# Patient Record
Sex: Male | Born: 1970 | Race: White | Hispanic: No | Marital: Married | Smoking: Never smoker
Health system: Southern US, Community
[De-identification: ages and names within clinical notes are randomized; demographics above are authoritative.]

## PROBLEM LIST (undated history)

## (undated) DIAGNOSIS — M545 Low back pain, unspecified: Secondary | ICD-10-CM

## (undated) DIAGNOSIS — Z87442 Personal history of urinary calculi: Secondary | ICD-10-CM

## (undated) DIAGNOSIS — E785 Hyperlipidemia, unspecified: Secondary | ICD-10-CM

## (undated) DIAGNOSIS — T300 Burn of unspecified body region, unspecified degree: Secondary | ICD-10-CM

## (undated) DIAGNOSIS — T7840XA Allergy, unspecified, initial encounter: Secondary | ICD-10-CM

## (undated) HISTORY — DX: Burn of unspecified body region, unspecified degree: T30.0

## (undated) HISTORY — DX: Allergy, unspecified, initial encounter: T78.40XA

## (undated) HISTORY — DX: Hyperlipidemia, unspecified: E78.5

## (undated) HISTORY — DX: Low back pain, unspecified: M54.50

## (undated) HISTORY — DX: Low back pain: M54.5

## (undated) HISTORY — PX: OTHER SURGICAL HISTORY: SHX169

## (undated) HISTORY — PX: WISDOM TOOTH EXTRACTION: SHX21

---

## 1992-09-22 HISTORY — PX: OTHER SURGICAL HISTORY: SHX169

## 2007-03-30 ENCOUNTER — Ambulatory Visit: Payer: Self-pay | Admitting: Family Medicine

## 2007-07-07 ENCOUNTER — Ambulatory Visit: Payer: Self-pay | Admitting: Family Medicine

## 2007-07-07 LAB — CONVERTED CEMR LAB
Bilirubin Urine: NEGATIVE
Blood in Urine, dipstick: NEGATIVE
Ketones, urine, test strip: NEGATIVE
pH: 6

## 2007-07-09 LAB — CONVERTED CEMR LAB
Alkaline Phosphatase: 41 units/L (ref 39–117)
BUN: 14 mg/dL (ref 6–23)
Basophils Absolute: 0 10*3/uL (ref 0.0–0.1)
CO2: 28 meq/L (ref 19–32)
Cholesterol: 191 mg/dL (ref 0–200)
Eosinophils Absolute: 0.1 10*3/uL (ref 0.0–0.6)
GFR calc Af Amer: 99 mL/min
GFR calc non Af Amer: 81 mL/min
HDL: 40.3 mg/dL (ref >39.0)
Hemoglobin: 15.6 g/dL (ref 13.0–17.0)
Lymphocytes Relative: 36.3 % (ref 12.0–46.0)
MCHC: 35.6 g/dL (ref 30.0–36.0)
MCV: 91.2 fL (ref 78.0–100.0)
Monocytes Absolute: 0.4 10*3/uL (ref 0.2–0.7)
Monocytes Relative: 11.2 % — ABNORMAL HIGH (ref 3.0–11.0)
Neutro Abs: 1.6 10*3/uL (ref 1.4–7.7)
Platelets: 158 10*3/uL (ref 150–400)
Potassium: 4.6 meq/L (ref 3.5–5.1)
Total Protein: 6.5 g/dL (ref 6.0–8.3)
Triglycerides: 144 mg/dL (ref 0–149)

## 2007-07-14 ENCOUNTER — Ambulatory Visit: Payer: Self-pay | Admitting: Family Medicine

## 2007-07-14 DIAGNOSIS — M545 Low back pain, unspecified: Secondary | ICD-10-CM | POA: Insufficient documentation

## 2007-07-14 DIAGNOSIS — E785 Hyperlipidemia, unspecified: Secondary | ICD-10-CM | POA: Insufficient documentation

## 2007-07-14 DIAGNOSIS — T24309A Burn of third degree of unspecified site of unspecified lower limb, except ankle and foot, initial encounter: Secondary | ICD-10-CM | POA: Insufficient documentation

## 2007-09-24 ENCOUNTER — Ambulatory Visit: Payer: Self-pay | Admitting: Family Medicine

## 2007-09-24 DIAGNOSIS — J018 Other acute sinusitis: Secondary | ICD-10-CM | POA: Insufficient documentation

## 2007-11-18 ENCOUNTER — Ambulatory Visit: Payer: Self-pay | Admitting: Family Medicine

## 2007-11-18 DIAGNOSIS — L259 Unspecified contact dermatitis, unspecified cause: Secondary | ICD-10-CM | POA: Insufficient documentation

## 2008-06-13 ENCOUNTER — Ambulatory Visit: Payer: Self-pay | Admitting: Family Medicine

## 2008-06-13 DIAGNOSIS — B356 Tinea cruris: Secondary | ICD-10-CM | POA: Insufficient documentation

## 2008-09-20 ENCOUNTER — Telehealth: Payer: Self-pay | Admitting: Family Medicine

## 2009-04-05 ENCOUNTER — Ambulatory Visit: Payer: Self-pay | Admitting: Family Medicine

## 2009-04-05 DIAGNOSIS — R002 Palpitations: Secondary | ICD-10-CM | POA: Insufficient documentation

## 2009-04-11 LAB — CONVERTED CEMR LAB
Alkaline Phosphatase: 41 units/L (ref 39–117)
Basophils Absolute: 0 10*3/uL (ref 0.0–0.1)
Bilirubin, Direct: 0.1 mg/dL (ref 0.0–0.3)
CO2: 29 meq/L (ref 19–32)
Calcium: 9.2 mg/dL (ref 8.4–10.5)
Cholesterol: 220 mg/dL — ABNORMAL HIGH (ref 0–200)
Creatinine, Ser: 1 mg/dL (ref 0.4–1.5)
Eosinophils Absolute: 0.1 10*3/uL (ref 0.0–0.7)
Glucose, Bld: 91 mg/dL (ref 70–99)
HDL: 48.3 mg/dL (ref >39.00)
Lymphocytes Relative: 33.4 % (ref 12.0–46.0)
MCHC: 35.2 g/dL (ref 30.0–36.0)
Neutrophils Relative %: 53 % (ref 43.0–77.0)
RBC: 5 M/uL (ref 4.22–5.81)
RDW: 11.7 % (ref 11.5–14.6)
Total CHOL/HDL Ratio: 5
Triglycerides: 205 mg/dL — ABNORMAL HIGH (ref 0.0–149.0)

## 2009-04-19 ENCOUNTER — Ambulatory Visit: Payer: Self-pay

## 2009-04-19 ENCOUNTER — Encounter: Payer: Self-pay | Admitting: Internal Medicine

## 2009-12-13 ENCOUNTER — Ambulatory Visit: Payer: Self-pay | Admitting: Family Medicine

## 2009-12-13 DIAGNOSIS — J309 Allergic rhinitis, unspecified: Secondary | ICD-10-CM | POA: Insufficient documentation
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
## 2011-01-09 ENCOUNTER — Other Ambulatory Visit (INDEPENDENT_AMBULATORY_CARE_PROVIDER_SITE_OTHER): Payer: 59 | Admitting: Family Medicine

## 2011-01-09 DIAGNOSIS — Z0000 Encounter for general adult medical examination without abnormal findings: Secondary | ICD-10-CM

## 2011-01-09 DIAGNOSIS — E785 Hyperlipidemia, unspecified: Secondary | ICD-10-CM

## 2011-01-09 LAB — BASIC METABOLIC PANEL
CO2: 29 mEq/L (ref 19–32)
Glucose, Bld: 82 mg/dL (ref 70–99)
Potassium: 4.6 mEq/L (ref 3.5–5.1)
Sodium: 140 mEq/L (ref 135–145)

## 2011-01-09 LAB — HEPATIC FUNCTION PANEL
AST: 24 U/L (ref 0–37)
Albumin: 3.9 g/dL (ref 3.5–5.2)

## 2011-01-09 LAB — CBC WITH DIFFERENTIAL/PLATELET
Basophils Absolute: 0 10*3/uL (ref 0.0–0.1)
Eosinophils Absolute: 0.1 10*3/uL (ref 0.0–0.7)
HCT: 44.9 % (ref 39.0–52.0)
Hemoglobin: 15.7 g/dL (ref 13.0–17.0)
Lymphs Abs: 1.2 10*3/uL (ref 0.7–4.0)
MCHC: 35 g/dL (ref 30.0–36.0)
Monocytes Relative: 10.6 % (ref 3.0–12.0)
Neutro Abs: 2.1 10*3/uL (ref 1.4–7.7)
RDW: 12.6 % (ref 11.5–14.6)

## 2011-01-09 LAB — LIPID PANEL
Cholesterol: 216 mg/dL — ABNORMAL HIGH (ref 0–200)
VLDL: 44.6 mg/dL — ABNORMAL HIGH (ref 0.0–40.0)

## 2011-01-13 ENCOUNTER — Ambulatory Visit (INDEPENDENT_AMBULATORY_CARE_PROVIDER_SITE_OTHER): Payer: 59 | Admitting: Family Medicine

## 2011-01-13 ENCOUNTER — Encounter: Payer: Self-pay | Admitting: Family Medicine

## 2011-01-13 VITALS — BP 108/64 | HR 81 | Temp 97.5°F | Resp 16 | Wt 193.3 lb

## 2011-01-13 DIAGNOSIS — J40 Bronchitis, not specified as acute or chronic: Secondary | ICD-10-CM

## 2011-01-13 MED ORDER — AZITHROMYCIN 250 MG PO TABS: ORAL_TABLET | ORAL | Status: AC

## 2011-01-13 NOTE — Progress Notes (Signed)
Pt. Informed.

## 2011-01-13 NOTE — Progress Notes (Signed)
  Subjective:    Patient ID: Chris Collier, male    DOB: May 03, 1971, 40 y.o.   MRN: [id number]  HPI Here for 3 days of fever, PND, HA, and a dry cough. On Nyquil and fluids.    Review of Systems  Constitutional: Positive for fever and chills.  HENT: Positive for congestion and postnasal drip.   Eyes: Negative.   Respiratory: Positive for cough.   Cardiovascular: Negative.        Objective:   Physical Exam  Constitutional: He appears well-developed and well-nourished.  HENT:  Right Ear: External ear normal.  Left Ear: External ear normal.  Nose: Nose normal.  Mouth/Throat: Oropharynx is clear and moist. No oropharyngeal exudate.  Eyes: Conjunctivae are normal. Pupils are equal, round, and reactive to light.  Neck: No thyromegaly present.  Pulmonary/Chest: Effort normal. He has no wheezes. He has no rales. He exhibits no tenderness.       Diffuse rhonchi  Lymphadenopathy:    He has no cervical adenopathy.          Assessment & Plan:  Off work today.

## 2011-01-21 ENCOUNTER — Encounter: Payer: Self-pay | Admitting: Family Medicine
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
## 2011-02-24 ENCOUNTER — Ambulatory Visit (INDEPENDENT_AMBULATORY_CARE_PROVIDER_SITE_OTHER): Payer: 59 | Admitting: Family Medicine

## 2011-02-24 ENCOUNTER — Encounter: Payer: Self-pay | Admitting: Family Medicine

## 2011-02-24 VITALS — BP 118/80 | HR 80 | Temp 97.7°F | Resp 18 | Wt 199.0 lb

## 2011-02-24 DIAGNOSIS — R6882 Decreased libido: Secondary | ICD-10-CM

## 2011-02-24 DIAGNOSIS — Z0000 Encounter for general adult medical examination without abnormal findings: Secondary | ICD-10-CM

## 2011-02-24 NOTE — Progress Notes (Signed)
  Subjective:    Patient ID: Chris Collier, male    DOB: 01-12-1971, 40 y.o.   MRN: [id number]  HPI 40 yr old male for a cpx. He is watching his diet and exercising. He asks if we can check his testosterone level because he has been struggling with a low libido for awhile. He thinks it is stress related, since he has a stressful job and he has had some problems at home. He and his wife are having difficulties, and they ae considering seeing a marriage Veterinary surgeon. He admits to feeling anxious and somewhat supressed, and he tends to lose his temper quickly. He sleeps well , and his appetite is normal.    Review of Systems  Constitutional: Negative.   HENT: Negative.   Eyes: Negative.   Respiratory: Negative.   Cardiovascular: Negative.   Gastrointestinal: Negative.   Genitourinary: Negative.   Musculoskeletal: Negative.   Skin: Negative.   Neurological: Negative.   Hematological: Negative.   Psychiatric/Behavioral: Negative.        Objective:   Physical Exam  Constitutional: He is oriented to person, place, and time. He appears well-developed and well-nourished. No distress.  HENT:  Head: Normocephalic and atraumatic.  Right Ear: External ear normal.  Left Ear: External ear normal.  Nose: Nose normal.  Mouth/Throat: Oropharynx is clear and moist. No oropharyngeal exudate.  Eyes: Conjunctivae and EOM are normal. Pupils are equal, round, and reactive to light. Right eye exhibits no discharge. Left eye exhibits no discharge. No scleral icterus.  Neck: Neck supple. No JVD present. No tracheal deviation present. No thyromegaly present.  Cardiovascular: Normal rate, regular rhythm, normal heart sounds and intact distal pulses.  Exam reveals no gallop and no friction rub.   No murmur heard. Pulmonary/Chest: Effort normal and breath sounds normal. No respiratory distress. He has no wheezes. He has no rales. He exhibits no tenderness.  Abdominal: Soft. Bowel sounds are normal. He exhibits no  distension and no mass. There is no tenderness. There is no rebound and no guarding.  Genitourinary: Penis normal. No penile tenderness.  Musculoskeletal: Normal range of motion. He exhibits no edema and no tenderness.  Lymphadenopathy:    He has no cervical adenopathy.  Neurological: He is alert and oriented to person, place, and time. He has normal reflexes. No cranial nerve deficit. He exhibits normal muscle tone. Coordination normal.  Skin: Skin is warm and dry. No rash noted. He is not diaphoretic. No erythema. No pallor.  Psychiatric: He has a normal mood and affect. His behavior is normal. Judgment and thought content normal.          Assessment & Plan:  We discussed ways to treat depression, and I encouraged him to talk to the counselor, both with  his wife and by himself. He does not want to try any medications at this time. We will check a testosterone level today

## 2011-02-26 ENCOUNTER — Encounter: Payer: Self-pay | Admitting: Family Medicine

## 2011-05-07 ENCOUNTER — Telehealth: Payer: Self-pay | Admitting: *Deleted

## 2011-05-07 NOTE — Telephone Encounter (Signed)
He needs an OV  

## 2011-05-07 NOTE — Telephone Encounter (Signed)
Pt is asking for Rx re: depression...Chris KitchenMarland KitchenMarland Kitchenrelationship problems with wife, appetite fair, sleep ok, no suicidal ideation.  Pt is asking for Dr. Claris Che recommendations, and if he needs to be seen, he will make appt.

## 2011-05-08 ENCOUNTER — Encounter: Payer: Self-pay | Admitting: Family Medicine

## 2011-05-08 NOTE — Telephone Encounter (Signed)
Office visit scheduled.

## 2011-05-09 ENCOUNTER — Ambulatory Visit (INDEPENDENT_AMBULATORY_CARE_PROVIDER_SITE_OTHER): Payer: 59 | Admitting: Family Medicine

## 2011-05-09 ENCOUNTER — Encounter: Payer: Self-pay | Admitting: Family Medicine

## 2011-05-09 VITALS — BP 126/80 | HR 92 | Temp 98.0°F | Wt 195.0 lb

## 2011-05-09 DIAGNOSIS — F418 Other specified anxiety disorders: Secondary | ICD-10-CM

## 2011-05-09 DIAGNOSIS — F341 Dysthymic disorder: Secondary | ICD-10-CM

## 2011-05-09 MED ORDER — SERTRALINE HCL 50 MG PO TABS: 50.0000 mg | ORAL_TABLET | Freq: Every day | ORAL | Status: DC

## 2011-05-09 NOTE — Progress Notes (Signed)
  Subjective:    Patient ID: Chris Collier, male    DOB: [birth year], 40 y.o.   MRN: [id number]  HPI Here to discuss getting on a med for anxiety and depression. We spoke recently about his marriage problems, and in fact they are now living in separate places. He is nervous and sad and angry off and on.   Review of Systems  Constitutional: Negative.   Psychiatric/Behavioral: Positive for dysphoric mood, decreased concentration and agitation. The patient is nervous/anxious.        Objective:   Physical Exam  Constitutional: He appears well-developed and well-nourished.  Psychiatric: He has a normal mood and affect. His behavior is normal. Judgment and thought content normal.          Assessment & Plan:  Start on Zoloft. They are still in counseling. Recheck in one month

## 2011-08-01 ENCOUNTER — Telehealth: Payer: Self-pay | Admitting: *Deleted

## 2011-08-01 NOTE — Telephone Encounter (Signed)
Pt would like to increase dosage of Zoloft to 100 mg or to an extended release, please.

## 2011-08-04 MED ORDER — SERTRALINE HCL 100 MG PO TABS: 100.0000 mg | ORAL_TABLET | Freq: Every day | ORAL | Status: DC

## 2011-08-04 NOTE — Telephone Encounter (Signed)
Rx sent in to pharmacy. 

## 2011-08-04 NOTE — Telephone Encounter (Signed)
Okay to increase the Zoloft to 100 mg a day. Call in 6 month supply

## 2011-08-04 NOTE — Telephone Encounter (Signed)
Addended by: Azucena Freed on: 08/04/2011 04:42 PM   Modules accepted: Orders

## 2011-12-22 ENCOUNTER — Telehealth: Payer: Self-pay | Admitting: Family Medicine

## 2011-12-22 NOTE — Telephone Encounter (Signed)
Refill request for Androgel 1.62% gel pump apply 4 pumps qd and pt last here on 05/09/11.

## 2011-12-22 NOTE — Telephone Encounter (Signed)
Ok to refill 1 months worth Dr Clent Ridges out of office .

## 2011-12-23 MED ORDER — TESTOSTERONE 20.25 MG/ACT (1.62%) TD GEL: 4.0000 "application " | Freq: Every day | TRANSDERMAL | Status: DC

## 2011-12-23 NOTE — Telephone Encounter (Signed)
Script called in

## 2012-03-16 ENCOUNTER — Telehealth: Payer: Self-pay | Admitting: Family Medicine

## 2012-03-16 DIAGNOSIS — E291 Testicular hypofunction: Secondary | ICD-10-CM

## 2012-03-16 NOTE — Telephone Encounter (Signed)
Pt requesting labs for  Lipid and Testosterone. Please advise

## 2012-03-16 NOTE — Telephone Encounter (Signed)
I put in the orders, he just needs an appt time

## 2012-03-17 NOTE — Telephone Encounter (Signed)
lmom for pt to call back and schedule lab appt

## 2012-03-18 ENCOUNTER — Other Ambulatory Visit (INDEPENDENT_AMBULATORY_CARE_PROVIDER_SITE_OTHER): Payer: 59

## 2012-03-18 DIAGNOSIS — E291 Testicular hypofunction: Secondary | ICD-10-CM

## 2012-03-18 LAB — LIPID PANEL
Cholesterol: 215 mg/dL — ABNORMAL HIGH (ref 0–200)
HDL: 52.2 mg/dL (ref >39.00)
Total CHOL/HDL Ratio: 4
Triglycerides: 206 mg/dL — ABNORMAL HIGH (ref 0.0–149.0)
VLDL: 41.2 mg/dL — ABNORMAL HIGH (ref 0.0–40.0)

## 2012-03-18 LAB — LDL CHOLESTEROL, DIRECT: Direct LDL: 110.8 mg/dL

## 2012-03-22 NOTE — Progress Notes (Signed)
Quick Note:  I left voice message for pt to return my call. ______ 

## 2012-03-23 ENCOUNTER — Telehealth: Payer: Self-pay | Admitting: Family Medicine

## 2012-03-23 ENCOUNTER — Encounter: Payer: Self-pay | Admitting: Family Medicine

## 2012-03-23 DIAGNOSIS — E291 Testicular hypofunction: Secondary | ICD-10-CM

## 2012-03-23 NOTE — Telephone Encounter (Signed)
I spoke with pt and gave lab results and he would like the referral to see a Urologist.

## 2012-03-23 NOTE — Telephone Encounter (Signed)
Caller: Markavious/Patient; Phone Number: [phone]; Message from caller: Pt states he is returning a call to [place].

## 2012-03-23 NOTE — Progress Notes (Signed)
Quick Note:  I left voice message with results and I put a copy of results in mail. ______

## 2012-03-29 NOTE — Telephone Encounter (Signed)
I spoke with pt  

## 2012-03-29 NOTE — Telephone Encounter (Signed)
I did the referral 

## 2015-11-22 ENCOUNTER — Encounter (HOSPITAL_BASED_OUTPATIENT_CLINIC_OR_DEPARTMENT_OTHER): Payer: Self-pay | Admitting: *Deleted

## 2015-11-24 ENCOUNTER — Ambulatory Visit: Payer: Self-pay | Admitting: Surgery

## 2015-11-27 ENCOUNTER — Ambulatory Visit (HOSPITAL_BASED_OUTPATIENT_CLINIC_OR_DEPARTMENT_OTHER): Payer: 59 | Admitting: Anesthesiology

## 2015-11-27 ENCOUNTER — Encounter (HOSPITAL_BASED_OUTPATIENT_CLINIC_OR_DEPARTMENT_OTHER): Payer: Self-pay | Admitting: Anesthesiology

## 2015-11-27 ENCOUNTER — Encounter (HOSPITAL_BASED_OUTPATIENT_CLINIC_OR_DEPARTMENT_OTHER)
Admission: RE | Disposition: A | Discharge status: Home / Self Care | Payer: Self-pay | Source: Ambulatory Visit | Attending: Surgery

## 2015-11-27 ENCOUNTER — Ambulatory Visit (HOSPITAL_BASED_OUTPATIENT_CLINIC_OR_DEPARTMENT_OTHER)
Admission: RE | Admit: 2015-11-27 | Discharge: 2015-11-27 | Disposition: A | Discharge status: Home / Self Care | Payer: 59 | Source: Ambulatory Visit | Attending: Surgery | Admitting: Surgery

## 2015-11-27 DIAGNOSIS — K4090 Unilateral inguinal hernia, without obstruction or gangrene, not specified as recurrent: Secondary | ICD-10-CM | POA: Insufficient documentation

## 2015-11-27 HISTORY — PX: INGUINAL HERNIA REPAIR: SHX194

## 2015-11-27 SURGERY — REPAIR, HERNIA, INGUINAL, ADULT
Anesthesia: General | Site: Abdomen | Laterality: Left

## 2015-11-27 MED ORDER — ATROPINE SULFATE 0.4 MG/ML IJ SOLN
INTRAMUSCULAR | Status: AC
  Filled 2015-11-27: qty 1

## 2015-11-27 MED ORDER — FENTANYL CITRATE (PF) 100 MCG/2ML IJ SOLN
INTRAMUSCULAR | Status: AC
  Filled 2015-11-27: qty 2

## 2015-11-27 MED ORDER — MIDAZOLAM HCL 2 MG/2ML IJ SOLN
1.0000 mg | INTRAMUSCULAR | Status: DC | PRN
  Administered 2015-11-27: 2 mg via INTRAVENOUS

## 2015-11-27 MED ORDER — OXYCODONE HCL 5 MG PO TABS
5.0000 mg | ORAL_TABLET | Freq: Once | ORAL | Status: AC | PRN
Start: 2015-11-27 — End: 2015-11-27
  Administered 2015-11-27: 5 mg via ORAL

## 2015-11-27 MED ORDER — ONDANSETRON HCL 4 MG/2ML IJ SOLN
INTRAMUSCULAR | Status: AC
  Filled 2015-11-27: qty 2

## 2015-11-27 MED ORDER — SCOPOLAMINE 1 MG/3DAYS TD PT72: 1.0000 | MEDICATED_PATCH | Freq: Once | TRANSDERMAL | Status: DC | PRN

## 2015-11-27 MED ORDER — PROPOFOL 10 MG/ML IV BOLUS
INTRAVENOUS | Status: DC | PRN
  Administered 2015-11-27: 200 mg via INTRAVENOUS

## 2015-11-27 MED ORDER — MIDAZOLAM HCL 2 MG/2ML IJ SOLN
INTRAMUSCULAR | Status: AC
  Filled 2015-11-27: qty 2

## 2015-11-27 MED ORDER — FENTANYL CITRATE (PF) 100 MCG/2ML IJ SOLN
50.0000 ug | INTRAMUSCULAR | Status: DC | PRN
  Administered 2015-11-27: 100 ug via INTRAVENOUS

## 2015-11-27 MED ORDER — EPHEDRINE SULFATE 50 MG/ML IJ SOLN
INTRAMUSCULAR | Status: AC
  Filled 2015-11-27: qty 1

## 2015-11-27 MED ORDER — DEXAMETHASONE SODIUM PHOSPHATE 10 MG/ML IJ SOLN
INTRAMUSCULAR | Status: AC
  Filled 2015-11-27: qty 1

## 2015-11-27 MED ORDER — PHENYLEPHRINE HCL 10 MG/ML IJ SOLN
INTRAMUSCULAR | Status: AC
  Filled 2015-11-27: qty 1

## 2015-11-27 MED ORDER — GLYCOPYRROLATE 0.2 MG/ML IJ SOLN: 0.2000 mg | Freq: Once | INTRAMUSCULAR | Status: DC | PRN

## 2015-11-27 MED ORDER — HYDROMORPHONE HCL 1 MG/ML IJ SOLN: 0.2500 mg | INTRAMUSCULAR | Status: DC | PRN

## 2015-11-27 MED ORDER — HEPARIN SODIUM (PORCINE) 5000 UNIT/ML IJ SOLN: 5000.0000 [IU] | Freq: Once | INTRAMUSCULAR | Status: DC

## 2015-11-27 MED ORDER — OXYCODONE HCL 5 MG PO TABS
ORAL_TABLET | ORAL | Status: AC
  Filled 2015-11-27: qty 1

## 2015-11-27 MED ORDER — FENTANYL CITRATE (PF) 100 MCG/2ML IJ SOLN
INTRAMUSCULAR | Status: DC | PRN
  Administered 2015-11-27: 100 ug via INTRAVENOUS

## 2015-11-27 MED ORDER — GLYCOPYRROLATE 0.2 MG/ML IJ SOLN
INTRAMUSCULAR | Status: AC
  Filled 2015-11-27: qty 2

## 2015-11-27 MED ORDER — BUPIVACAINE-EPINEPHRINE (PF) 0.5% -1:200000 IJ SOLN
INTRAMUSCULAR | Status: DC | PRN
  Administered 2015-11-27: 30 mL via PERINEURAL

## 2015-11-27 MED ORDER — ONDANSETRON HCL 4 MG/2ML IJ SOLN
INTRAMUSCULAR | Status: DC | PRN
  Administered 2015-11-27: 4 mg via INTRAVENOUS

## 2015-11-27 MED ORDER — OXYCODONE HCL 5 MG/5ML PO SOLN: 5.0000 mg | Freq: Once | ORAL | Status: AC | PRN

## 2015-11-27 MED ORDER — SUCCINYLCHOLINE CHLORIDE 20 MG/ML IJ SOLN
INTRAMUSCULAR | Status: DC | PRN
  Administered 2015-11-27: 50 mg via INTRAVENOUS

## 2015-11-27 MED ORDER — BUPIVACAINE HCL (PF) 0.25 % IJ SOLN
INTRAMUSCULAR | Status: DC | PRN
  Administered 2015-11-27: 6 mL

## 2015-11-27 MED ORDER — CHLORHEXIDINE GLUCONATE 4 % EX LIQD: 1.0000 "application " | Freq: Once | CUTANEOUS | Status: DC

## 2015-11-27 MED ORDER — SUCCINYLCHOLINE CHLORIDE 20 MG/ML IJ SOLN
INTRAMUSCULAR | Status: AC
  Filled 2015-11-27: qty 1

## 2015-11-27 MED ORDER — MEPERIDINE HCL 25 MG/ML IJ SOLN: 6.2500 mg | INTRAMUSCULAR | Status: DC | PRN

## 2015-11-27 MED ORDER — CEFAZOLIN SODIUM-DEXTROSE 2-3 GM-% IV SOLR
INTRAVENOUS | Status: AC
  Filled 2015-11-27: qty 50

## 2015-11-27 MED ORDER — LIDOCAINE HCL (CARDIAC) 20 MG/ML IV SOLN
INTRAVENOUS | Status: AC
  Filled 2015-11-27: qty 5

## 2015-11-27 MED ORDER — HYDROCODONE-ACETAMINOPHEN 5-325 MG PO TABS: 1.0000 | ORAL_TABLET | ORAL | Status: DC | PRN

## 2015-11-27 MED ORDER — LIDOCAINE HCL (CARDIAC) 20 MG/ML IV SOLN
INTRAVENOUS | Status: DC | PRN
  Administered 2015-11-27: 50 mg via INTRAVENOUS

## 2015-11-27 MED ORDER — CEFAZOLIN SODIUM-DEXTROSE 2-3 GM-% IV SOLR
2.0000 g | INTRAVENOUS | Status: AC
  Administered 2015-11-27: 2 g via INTRAVENOUS

## 2015-11-27 MED ORDER — MIDAZOLAM HCL 5 MG/5ML IJ SOLN
INTRAMUSCULAR | Status: DC | PRN
  Administered 2015-11-27: 2 mg via INTRAVENOUS

## 2015-11-27 MED ORDER — DEXAMETHASONE SODIUM PHOSPHATE 4 MG/ML IJ SOLN
INTRAMUSCULAR | Status: DC | PRN
  Administered 2015-11-27: 10 mg via INTRAVENOUS

## 2015-11-27 MED ORDER — PROPOFOL 10 MG/ML IV BOLUS
INTRAVENOUS | Status: AC
  Filled 2015-11-27: qty 40

## 2015-11-27 MED ORDER — LACTATED RINGERS IV SOLN
INTRAVENOUS | Status: DC
Start: 2015-11-27 — End: 2015-11-27
  Administered 2015-11-27: 12:00:00 via INTRAVENOUS
  Administered 2015-11-27: 10 mL/h via INTRAVENOUS

## 2015-11-27 SURGICAL SUPPLY — 50 items
APL SKNCLS STERI-STRIP NONHPOA (GAUZE/BANDAGES/DRESSINGS)
BENZOIN TINCTURE PRP APPL 2/3 (GAUZE/BANDAGES/DRESSINGS) IMPLANT
BLADE CLIPPER SURG (BLADE) IMPLANT
BLADE SURG 15 STRL LF DISP TIS (BLADE) ×1 IMPLANT
BLADE SURG 15 STRL SS (BLADE) ×2
CANISTER SUCT 1200ML W/VALVE (MISCELLANEOUS) ×2 IMPLANT
CLEANER CAUTERY TIP 5X5 PAD (MISCELLANEOUS) ×1 IMPLANT
COVER BACK TABLE 60X90IN (DRAPES) ×2 IMPLANT
COVER MAYO STAND STRL (DRAPES) ×2 IMPLANT
DECANTER SPIKE VIAL GLASS SM (MISCELLANEOUS) ×2 IMPLANT
DRAIN PENROSE 1/2X12 LTX STRL (WOUND CARE) ×2 IMPLANT
DRAPE LAPAROTOMY T 102X78X121 (DRAPES) ×2 IMPLANT
ELECT REM PT RETURN 9FT ADLT (ELECTROSURGICAL) ×2
ELECTRODE REM PT RTRN 9FT ADLT (ELECTROSURGICAL) ×1 IMPLANT
GAUZE SPONGE 4X4 16PLY XRAY LF (GAUZE/BANDAGES/DRESSINGS) IMPLANT
GLOVE BIO SURGEON STRL SZ 6.5 (GLOVE) ×2 IMPLANT
GLOVE BIO SURGEON STRL SZ8 (GLOVE) ×2 IMPLANT
GLOVE BIOGEL PI IND STRL 7.0 (GLOVE) IMPLANT
GLOVE BIOGEL PI INDICATOR 7.0 (GLOVE) ×1
GOWN STRL REUS W/ TWL LRG LVL3 (GOWN DISPOSABLE) ×1 IMPLANT
GOWN STRL REUS W/ TWL XL LVL3 (GOWN DISPOSABLE) ×1 IMPLANT
GOWN STRL REUS W/TWL LRG LVL3 (GOWN DISPOSABLE) ×2
GOWN STRL REUS W/TWL XL LVL3 (GOWN DISPOSABLE) ×2
LIQUID BAND (GAUZE/BANDAGES/DRESSINGS) ×1 IMPLANT
MESH HERNIA 3X6 (Mesh General) ×1 IMPLANT
NDL HYPO 25X1 1.5 SAFETY (NEEDLE) IMPLANT
NEEDLE HYPO 25X1 1.5 SAFETY (NEEDLE) IMPLANT
NS IRRIG 1000ML POUR BTL (IV SOLUTION) ×2 IMPLANT
PACK BASIN DAY SURGERY FS (CUSTOM PROCEDURE TRAY) ×2 IMPLANT
PAD CLEANER CAUTERY TIP 5X5 (MISCELLANEOUS) ×1
PENCIL BUTTON HOLSTER BLD 10FT (ELECTRODE) ×2 IMPLANT
SLEEVE SCD COMPRESS KNEE MED (MISCELLANEOUS) IMPLANT
SPONGE GAUZE 4X4 12PLY STER LF (GAUZE/BANDAGES/DRESSINGS) ×4 IMPLANT
STAPLER VISISTAT 35W (STAPLE) IMPLANT
STRIP CLOSURE SKIN 1/2X4 (GAUZE/BANDAGES/DRESSINGS) IMPLANT
SUT MON AB 5-0 PS2 18 (SUTURE) ×1 IMPLANT
SUT PROLENE 2 0 CT2 30 (SUTURE) ×4 IMPLANT
SUT SILK 2 0 SH (SUTURE) ×1 IMPLANT
SUT VIC AB 2-0 SH 27 (SUTURE) ×2
SUT VIC AB 2-0 SH 27XBRD (SUTURE) ×1 IMPLANT
SUT VIC AB 4-0 SH 18 (SUTURE) ×2 IMPLANT
SUT VIC AB 5-0 P-3 18X BRD (SUTURE) IMPLANT
SUT VIC AB 5-0 P3 18 (SUTURE)
SUT VICRYL 4-0 PS2 18IN ABS (SUTURE) IMPLANT
SYR BULB 3OZ (MISCELLANEOUS) ×2 IMPLANT
SYR CONTROL 10ML LL (SYRINGE) IMPLANT
TOWEL OR 17X24 6PK STRL BLUE (TOWEL DISPOSABLE) ×4 IMPLANT
TRAY DSU PREP LF (CUSTOM PROCEDURE TRAY) ×2 IMPLANT
TUBE CONNECTING 20X1/4 (TUBING) ×2 IMPLANT
YANKAUER SUCT BULB TIP NO VENT (SUCTIONS) ×2 IMPLANT
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
## 2015-11-27 NOTE — Anesthesia Postprocedure Evaluation (Signed)
Anesthesia Post Note  Patient: Tal Mccreedy  Procedure(s) Performed: Procedure(s) (LRB):  OPEN LEFT INGUINAL HERNIA (Left)  Patient location during evaluation: PACU Anesthesia Type: General Level of consciousness: awake and alert Pain management: pain level controlled Vital Signs Assessment: post-procedure vital signs reviewed and stable Respiratory status: spontaneous breathing, nonlabored ventilation and respiratory function stable Cardiovascular status: blood pressure returned to baseline and stable Postop Assessment: no signs of nausea or vomiting Anesthetic complications: no    Last Vitals:  Filed Vitals:   11/27/15 1415 11/27/15 1430  BP: 136/92 133/91  Pulse: 73 78  Temp:    Resp: 12 14    Last Pain:  Filed Vitals:   11/27/15 1441  PainSc: 5                  Lamberto Dinapoli,Tavarius A
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
## 2015-11-27 NOTE — Discharge Instructions (Signed)
Inguinal Hernia, Adult °Muscles help keep everything in the body in its proper place. But if a weak spot in the muscles develops, something can poke through. That is called a hernia. When this happens in the lower part of the belly (abdomen), it is called an inguinal hernia. (It takes its name from a part of the body in this region called the inguinal canal.) A weak spot in the wall of muscles lets some fat or part of the small intestine bulge through. An inguinal hernia can develop at any age. Men get them more often than women. °CAUSES  °In adults, an inguinal hernia develops over time. °· It can be triggered by: °¨ Suddenly straining the muscles of the lower abdomen. °¨ Lifting heavy objects. °¨ Straining to have a bowel movement. Difficult bowel movements (constipation) can lead to this. °¨ Constant coughing. This may be caused by smoking or lung disease. °¨ Being overweight. °¨ Being pregnant. °¨ Working at a job that requires long periods of standing or heavy lifting. °¨ Having had an inguinal hernia before. °One type can be an emergency situation. It is called a strangulated inguinal hernia. It develops if part of the small intestine slips through the weak spot and cannot get back into the abdomen. The blood supply can be cut off. If that happens, part of the intestine may die. This situation requires emergency surgery. °SYMPTOMS  °Often, a small inguinal hernia has no symptoms. It is found when a healthcare provider does a physical exam. Larger hernias usually have symptoms.  °· In adults, symptoms may include: °¨ A lump in the groin. This is easier to see when the person is standing. It might disappear when lying down. °¨ In men, a lump in the scrotum. °¨ Pain or burning in the groin. This occurs especially when lifting, straining or coughing. °¨ A dull ache or feeling of pressure in the groin. °· Signs of a strangulated hernia can include: °¨ A bulge in the groin that becomes very painful and tender to the  touch. °¨ A bulge that turns red or purple. °¨ Fever, nausea and vomiting. °¨ Inability to have a bowel movement or to pass gas. °DIAGNOSIS  °To decide if you have an inguinal hernia, a healthcare provider will probably do a physical examination. °· This will include asking questions about any symptoms you have noticed. °· The healthcare provider might feel the groin area and ask you to cough. If an inguinal hernia is felt, the healthcare provider may try to slide it back into the abdomen. °· Usually no other tests are needed. °TREATMENT  °Treatments can vary. The size of the hernia makes a difference. Options include: °· Watchful waiting. This is often suggested if the hernia is small and you have had no symptoms. °¨ No medical procedure will be done unless symptoms develop. °¨ You will need to watch closely for symptoms. If any occur, contact your healthcare provider right away. °· Surgery. This is used if the hernia is larger or you have symptoms. °¨ Open surgery. This is usually an outpatient procedure (you will not stay overnight in a hospital). An cut (incision) is made through the skin in the groin. The hernia is put back inside the abdomen. The weak area in the muscles is then repaired by herniorrhaphy or hernioplasty. Herniorrhaphy: in this type of surgery, the weak muscles are sewn back together. Hernioplasty: a patch or mesh is used to close the weak area in the abdominal wall. °¨ Laparoscopy.   In this procedure, a surgeon makes small incisions. A thin tube with a tiny video camera (called a laparoscope) is put into the abdomen. The surgeon repairs the hernia with mesh by looking with the video camera and using two long instruments. HOME CARE INSTRUCTIONS   After surgery to repair an inguinal hernia:  You will need to take pain medicine prescribed by your healthcare provider. Follow all directions carefully.  You will need to take care of the wound from the incision.  Your activity will be  restricted for awhile. This will probably include no heavy lifting for several weeks. You also should not do anything too active for a few weeks. When you can return to work will depend on the type of job that you have.  During "watchful waiting" periods, you should:  Maintain a healthy weight.  Eat a diet high in fiber (fruits, vegetables and whole grains).  Drink plenty of fluids to avoid constipation. This means drinking enough water and other liquids to keep your urine clear or pale yellow.  Do not lift heavy objects.  Do not stand for long periods of time.  Quit smoking. This should keep you from developing a frequent cough. SEEK MEDICAL CARE IF:   A bulge develops in your groin area.  You feel pain, a burning sensation or pressure in the groin. This might be worse if you are lifting or straining.  You develop a fever of more than 100.5 F (38.1 C). SEEK IMMEDIATE MEDICAL CARE IF:   Pain in the groin increases suddenly.  A bulge in the groin gets bigger suddenly and does not go down.  For men, there is sudden pain in the scrotum. Or, the size of the scrotum increases.  A bulge in the groin area becomes red or purple and is painful to touch.  You have nausea or vomiting that does not go away.  You feel your heart beating much faster than normal.  You cannot have a bowel movement or pass gas.  You develop a fever of more than 102.0 F (38.9 C).   This information is not intended to replace advice given to you by your health care provider. Make sure you discuss any questions you have with your health care provider.   Document Released: 01/25/2009 Document Revised: 12/01/2011 Document Reviewed: 03/12/2015 Elsevier Interactive Patient Education 2016 [place] [facility] Care Instructions  Activity: Get plenty of rest for the remainder of the day. A responsible adult should stay with you for 24 hours following the procedure.  For the next 24  hours, DO NOT: -Drive a car -Paediatric nurse -Drink alcoholic beverages -Take any medication unless instructed by your physician -Make any legal decisions or sign important papers.  Meals: Start with liquid foods such as gelatin or soup. Progress to regular foods as tolerated. Avoid greasy, spicy, heavy foods. If nausea and/or vomiting occur, drink only clear liquids until the nausea and/or vomiting subsides. Call your physician if vomiting continues.  Special Instructions/Symptoms: Your throat may feel dry or sore from the anesthesia or the breathing tube placed in your throat during surgery. If this causes discomfort, gargle with warm salt water. The discomfort should disappear within 24 hours.  If you had a scopolamine patch placed behind your ear for the management of post- operative nausea and/or vomiting:  1. The medication in the patch is effective for 72 hours, after which it should be removed.  Wrap patch in a tissue and discard  in the trash. Wash hands thoroughly with soap and water. 2. You may remove the patch earlier than 72 hours if you experience unpleasant side effects which may include dry mouth, dizziness or visual disturbances. 3. Avoid touching the patch. Wash your hands with soap and water after contact with the patch.

## 2015-11-27 NOTE — Brief Op Note (Signed)
11/27/2015  1:44 PM  PATIENT:  Veda Canning  45 y.o. male  PRE-OPERATIVE DIAGNOSIS:  left inguinal hernia  POST-OPERATIVE DIAGNOSIS:  left inguinal hernia  PROCEDURE:  Procedure(s):  OPEN LEFT INGUINAL HERNIA (Left)  SURGEON:  Surgeon(s) and Role:    * Johnathan Hausen, MD - Primary  PHYSICIAN ASSISTANT:   ASSISTANTS: none   ANESTHESIA:   general  EBL:  Total I/O In: 1200 [I.V.:1200] Out: -   BLOOD ADMINISTERED:none  DRAINS: none   LOCAL MEDICATIONS USED:  MARCAINE     SPECIMEN:  No Specimen  DISPOSITION OF SPECIMEN:  N/A  COUNTS:  YES  TOURNIQUET:  * No tourniquets in log *  DICTATION: .Other Dictation: Dictation Number [id number]  PLAN OF CARE: Discharge to home after PACU  PATIENT DISPOSITION:  PACU - hemodynamically stable.   Delay start of Pharmacological VTE agent (>24hrs) due to surgical blood loss or risk of bleeding: not applicable

## 2015-11-27 NOTE — Interval H&P Note (Signed)
History and Physical Interval Note:  11/27/2015 11:34 AM  Chris Collier  has presented today for surgery, with the diagnosis of left inguinal hernia  The various methods of treatment have been discussed with the patient and family. After consideration of risks, benefits and other options for treatment, the patient has consented to  Procedure(s):  OPEN LEFT INGUINAL HERNIA (Left) as a surgical intervention .  The patient's history has been reviewed, patient examined, no change in status, stable for surgery.  I have reviewed the patient's chart and labs.  Questions were answered to the patient's satisfaction.     Bayley Yarborough B

## 2015-11-27 NOTE — Transfer of Care (Signed)
Immediate Anesthesia Transfer of Care Note  Patient: Chris Collier  Procedure(s) Performed: Procedure(s):  OPEN LEFT INGUINAL HERNIA (Left)  Patient Location: PACU  Anesthesia Type:GA combined with regional for post-op pain  Level of Consciousness: sedated  Airway & Oxygen Therapy: Patient Spontanous Breathing and Patient connected to face mask oxygen  Post-op Assessment: Report given to RN and Post -op Vital signs reviewed and stable  Post vital signs: Reviewed and stable  Last Vitals:  Filed Vitals:   11/27/15 1035 11/27/15 1040  BP:  129/64  Pulse: 68 63  Temp:    Resp: 13 12    Complications: No apparent anesthesia complications

## 2015-11-27 NOTE — Anesthesia Preprocedure Evaluation (Signed)
Anesthesia Evaluation  Patient identified by MRN, date of birth, ID band Patient awake    Reviewed: Allergy & Precautions, NPO status , Patient's Chart, lab work & pertinent test results  Airway Mallampati: I  TM Distance: >3 FB Neck ROM: Full    Dental  (+) Teeth Intact, Dental Advisory Given   Pulmonary    breath sounds clear to auscultation       Cardiovascular  Rhythm:Regular Rate:Normal     Neuro/Psych    GI/Hepatic   Endo/Other    Renal/GU      Musculoskeletal   Abdominal   Peds  Hematology   Anesthesia Other Findings   Reproductive/Obstetrics                             Anesthesia Physical Anesthesia Plan  ASA: I  Anesthesia Plan: General   Post-op Pain Management: MAC Combined w/ Regional for Post-op pain   Induction: Intravenous  Airway Management Planned: LMA and Oral ETT  Additional Equipment:   Intra-op Plan:   Post-operative Plan: Extubation in OR  Informed Consent: I have reviewed the patients History and Physical, chart, labs and discussed the procedure including the risks, benefits and alternatives for the proposed anesthesia with the patient or authorized representative who has indicated his/her understanding and acceptance.   Dental advisory given  Plan Discussed with: CRNA, Anesthesiologist and Surgeon  Anesthesia Plan Comments:         Anesthesia Quick Evaluation

## 2015-11-27 NOTE — Anesthesia Procedure Notes (Addendum)
Anesthesia Regional Block:  TAP block  Pre-Anesthetic Checklist: ,, timeout performed, Correct Patient, Correct Site, Correct Laterality, Correct Procedure, Correct Position, site marked, Risks and benefits discussed,  Surgical consent,  Pre-op evaluation,  At surgeon's request and post-op pain management  Laterality: Left and Lower  Prep: chloraprep       Needles:  Injection technique: Single-shot  Needle Type: Echogenic Needle     Needle Length: 9cm 9 cm Needle Gauge: 21 and 21 G    Additional Needles:  Procedures: ultrasound guided (picture in chart) TAP block Narrative:  Start time: 11/27/2015 10:39 AM End time: 11/27/2015 10:44 AM Injection made incrementally with aspirations every 5 mL.  Performed by: Personally  Anesthesiologist: CREWS, Nazaire   Procedure Name: Intubation Date/Time: 11/27/2015 11:44 AM Performed by: Marrianne Mood Pre-anesthesia Checklist: Patient identified, Emergency Drugs available, Suction available, Patient being monitored and Timeout performed Patient Re-evaluated:Patient Re-evaluated prior to inductionOxygen Delivery Method: Circle System Utilized Preoxygenation: Pre-oxygenation with 100% oxygen Intubation Type: IV induction Ventilation: Mask ventilation without difficulty Laryngoscope Size: Miller and 3 Grade View: Grade III Tube type: Oral Number of attempts: 1 Airway Equipment and Method: Stylet and Oral airway Placement Confirmation: ETT inserted through vocal cords under direct vision,  positive ETCO2 and breath sounds checked- equal and bilateral Secured at: 23 cm Tube secured with: Tape Dental Injury: Teeth and Oropharynx as per pre-operative assessment       Left TAP block image

## 2015-11-28 ENCOUNTER — Encounter (HOSPITAL_BASED_OUTPATIENT_CLINIC_OR_DEPARTMENT_OTHER): Payer: Self-pay | Admitting: Surgery

## 2015-11-28 NOTE — Op Note (Signed)
[name], [name] NO.:  [id number]  MEDICAL RECORD NO.:  [id number]  LOCATION:                                 FACILITY:  PHYSICIAN:  Isabel Caprice. Hassell Done, MD  DATE OF BIRTH:  [birth year]  DATE OF PROCEDURE:  11/27/2015 DATE OF DISCHARGE:                              OPERATIVE REPORT   PREOPERATIVE DIAGNOSIS:  Left inguinal hernia.  POSTOPERATIVE DIAGNOSIS:  Left indirect inguinal hernia.  PROCEDURE:  Left inguinal herniorrhaphy with mesh with high ligation of sac.  SURGEON:  Isabel Caprice. Hassell Done, MD.  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  Informed consent was obtained regarding open repair.  After the area was marked, he was taken back to room 8 at [facility] where under general anesthesia and after previous TAP block, he was prepped widely with PCMX and draped sterilely.  A small oblique incision was made in his left inguinal region, carried down through the fatty tissue to the external oblique. These were divided.  His cord was mobilized and proximally I found about a 3-cm indirect inguinal hernia with fat sliding down with it.  I performed a high ligation at the ring with 2-0 silk suture ligature and tucked all this back into his abdomen.  I closed the ring with a 2-0 Prolene to snug it up.  I then repaired the floor with a piece of Marlex type mesh, cut to fit, sutured along the inguinal ligament with a running 2-0 Prolene cut to fit around the cord structures and placed medially with running 2-0 Prolene.  It was sutured to itself with a horizontal mattress of 2-0 Prolene and tucked beneath the external oblique.  Supplemental 0.5% Marcaine was used.  The external oblique was closed with running 2-0 Vicryl.  A 4-0 Vicryl was used to close the Scarpa fascia, and then subcuticularly, a running 5-0 Monocryl was placed along with Dermabond or LiquiBand.  The patient tolerated  the procedure well, and was taken to the recovery room in satisfactory condition.     Isabel Caprice Hassell Done, MD     MBM/MEDQ  D:  11/27/2015  T:  11/27/2015  Job:  [id number]

## 2016-09-26 DIAGNOSIS — E291 Testicular hypofunction: Secondary | ICD-10-CM | POA: Diagnosis not present

## 2016-09-26 DIAGNOSIS — Z125 Encounter for screening for malignant neoplasm of prostate: Secondary | ICD-10-CM | POA: Diagnosis not present

## 2016-12-11 ENCOUNTER — Emergency Department (HOSPITAL_COMMUNITY)
Admission: EM | Admit: 2016-12-11 | Discharge: 2016-12-11 | Disposition: A | Discharge status: Home / Self Care | Payer: 59 | Attending: Emergency Medicine | Admitting: Emergency Medicine

## 2016-12-11 ENCOUNTER — Encounter (HOSPITAL_COMMUNITY): Payer: Self-pay | Admitting: Cardiology

## 2016-12-11 ENCOUNTER — Emergency Department (HOSPITAL_COMMUNITY): Payer: 59

## 2016-12-11 DIAGNOSIS — S60416A Abrasion of right little finger, initial encounter: Secondary | ICD-10-CM | POA: Insufficient documentation

## 2016-12-11 DIAGNOSIS — S50811A Abrasion of right forearm, initial encounter: Secondary | ICD-10-CM | POA: Insufficient documentation

## 2016-12-11 DIAGNOSIS — Y999 Unspecified external cause status: Secondary | ICD-10-CM | POA: Diagnosis not present

## 2016-12-11 DIAGNOSIS — S79911A Unspecified injury of right hip, initial encounter: Secondary | ICD-10-CM | POA: Diagnosis not present

## 2016-12-11 DIAGNOSIS — S80812A Abrasion, left lower leg, initial encounter: Secondary | ICD-10-CM | POA: Diagnosis not present

## 2016-12-11 DIAGNOSIS — Z23 Encounter for immunization: Secondary | ICD-10-CM | POA: Diagnosis not present

## 2016-12-11 DIAGNOSIS — M25561 Pain in right knee: Secondary | ICD-10-CM | POA: Diagnosis not present

## 2016-12-11 DIAGNOSIS — T148XXA Other injury of unspecified body region, initial encounter: Secondary | ICD-10-CM

## 2016-12-11 DIAGNOSIS — Y9389 Activity, other specified: Secondary | ICD-10-CM | POA: Insufficient documentation

## 2016-12-11 DIAGNOSIS — Y92410 Unspecified street and highway as the place of occurrence of the external cause: Secondary | ICD-10-CM | POA: Diagnosis not present

## 2016-12-11 DIAGNOSIS — F17290 Nicotine dependence, other tobacco product, uncomplicated: Secondary | ICD-10-CM | POA: Diagnosis not present

## 2016-12-11 DIAGNOSIS — R51 Headache: Secondary | ICD-10-CM | POA: Diagnosis not present

## 2016-12-11 DIAGNOSIS — S00411A Abrasion of right ear, initial encounter: Secondary | ICD-10-CM | POA: Diagnosis not present

## 2016-12-11 DIAGNOSIS — M25562 Pain in left knee: Secondary | ICD-10-CM | POA: Diagnosis not present

## 2016-12-11 DIAGNOSIS — S8992XA Unspecified injury of left lower leg, initial encounter: Secondary | ICD-10-CM | POA: Diagnosis not present

## 2016-12-11 DIAGNOSIS — M25551 Pain in right hip: Secondary | ICD-10-CM | POA: Diagnosis not present

## 2016-12-11 DIAGNOSIS — S81812A Laceration without foreign body, left lower leg, initial encounter: Secondary | ICD-10-CM | POA: Insufficient documentation

## 2016-12-11 MED ORDER — BACITRACIN ZINC 500 UNIT/GM EX OINT
TOPICAL_OINTMENT | CUTANEOUS | Status: AC
  Administered 2016-12-11: 1
  Filled 2016-12-11: qty 0.9

## 2016-12-11 MED ORDER — TETANUS-DIPHTH-ACELL PERTUSSIS 5-2.5-18.5 LF-MCG/0.5 IM SUSP
0.5000 mL | Freq: Once | INTRAMUSCULAR | Status: AC
  Administered 2016-12-11: 0.5 mL via INTRAMUSCULAR
  Filled 2016-12-11: qty 0.5

## 2016-12-11 MED ORDER — HYDROGEN PEROXIDE 3 % EX SOLN
CUTANEOUS | Status: AC
  Administered 2016-12-11: 1
  Filled 2016-12-11: qty 473

## 2016-12-11 MED ORDER — IBUPROFEN 800 MG PO TABS
800.0000 mg | ORAL_TABLET | Freq: Once | ORAL | Status: AC
  Administered 2016-12-11: 800 mg via ORAL
  Filled 2016-12-11: qty 1
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
## 2016-12-15 ENCOUNTER — Ambulatory Visit
Admission: RE | Admit: 2016-12-15 | Discharge: 2016-12-15 | Disposition: A | Discharge status: Home / Self Care | Payer: 59 | Source: Ambulatory Visit | Attending: Physician Assistant | Admitting: Physician Assistant

## 2016-12-15 ENCOUNTER — Other Ambulatory Visit: Payer: Self-pay | Admitting: Physician Assistant

## 2016-12-15 DIAGNOSIS — R51 Headache: Secondary | ICD-10-CM | POA: Diagnosis not present

## 2016-12-15 DIAGNOSIS — H05233 Hemorrhage of bilateral orbit: Secondary | ICD-10-CM

## 2016-12-15 DIAGNOSIS — S0990XA Unspecified injury of head, initial encounter: Secondary | ICD-10-CM

## 2016-12-15 DIAGNOSIS — H1131 Conjunctival hemorrhage, right eye: Secondary | ICD-10-CM

## 2016-12-24 DIAGNOSIS — M25561 Pain in right knee: Secondary | ICD-10-CM | POA: Diagnosis not present

## 2016-12-24 DIAGNOSIS — S0990XD Unspecified injury of head, subsequent encounter: Secondary | ICD-10-CM | POA: Diagnosis not present

## 2016-12-24 DIAGNOSIS — R413 Other amnesia: Secondary | ICD-10-CM | POA: Diagnosis not present

## 2017-01-05 ENCOUNTER — Telehealth: Payer: Self-pay

## 2017-01-05 ENCOUNTER — Ambulatory Visit: Payer: 59 | Admitting: Neurology
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
## 2017-01-14 DIAGNOSIS — S161XXA Strain of muscle, fascia and tendon at neck level, initial encounter: Secondary | ICD-10-CM | POA: Diagnosis not present

## 2017-01-14 DIAGNOSIS — M25561 Pain in right knee: Secondary | ICD-10-CM | POA: Diagnosis not present

## 2017-01-14 DIAGNOSIS — M542 Cervicalgia: Secondary | ICD-10-CM | POA: Diagnosis not present

## 2017-01-15 ENCOUNTER — Encounter: Payer: Self-pay | Admitting: Neurology

## 2017-01-15 ENCOUNTER — Ambulatory Visit (INDEPENDENT_AMBULATORY_CARE_PROVIDER_SITE_OTHER): Payer: 59 | Admitting: Neurology

## 2017-01-15 VITALS — BP 145/92 | HR 91 | Ht 72.0 in | Wt 182.0 lb

## 2017-01-15 DIAGNOSIS — F0781 Postconcussional syndrome: Secondary | ICD-10-CM | POA: Diagnosis not present

## 2017-01-15 DIAGNOSIS — R51 Headache: Secondary | ICD-10-CM | POA: Diagnosis not present

## 2017-01-15 DIAGNOSIS — Z7689 Persons encountering health services in other specified circumstances: Secondary | ICD-10-CM | POA: Diagnosis not present

## 2017-01-15 NOTE — Patient Instructions (Signed)
Remember to drink plenty of fluid, eat healthy meals and do not skip any meals. Try to eat protein with a every meal and eat a healthy snack such as fruit or nuts in between meals. Try to keep a regular sleep-wake schedule and try to exercise daily, particularly in the form of walking, 20-30 minutes a day, if you can.   As far as diagnostic testing: Labs  I would like to see you back as needed, sooner if we need to. Please call us with any interim questions, concerns, problems, updates or refill requests.    Our phone number is [phone]. We also have an after hours call service for urgent matters and there is a physician on-call for urgent questions. For any emergencies you know to call 911 or go to the nearest emergency room   Concussion, Adult A concussion is a brain injury from a direct hit (blow) to the head or body. This blow causes the brain to shake quickly back and forth inside the skull. This can damage brain cells and cause chemical changes in the brain. A concussion may also be known as a mild traumatic brain injury (TBI). Concussions are usually not life-threatening, but the effects of a concussion can be serious. If you have a concussion, you are more likely to experience concussion-like symptoms after a direct blow to the head in the future. What are the causes? This condition is caused by:  A direct blow to the head, such as from running into another player during a game, being hit in a fight, or hitting your head on a hard surface.  A jolt of the head or neck that causes the brain to move back and forth inside the skull, such as in a car crash. What are the signs or symptoms? The signs of a concussion can be hard to notice. Early on, they may be missed by you, family members, and health care providers. You may look fine but act or feel differently. Symptoms are usually temporary, but they may last for days, weeks, or even longer. Some symptoms may appear right away but other  symptoms may not show up for hours or days. Every head injury is different. Symptoms may include:  Headaches. This can include a feeling of pressure in the head.  Memory problems.  Trouble concentrating, organizing, or making decisions.  Slowness in thinking, acting or reacting, speaking, or reading.  Confusion.  Fatigue.  Changes in eating or sleeping patterns.  Problems with coordination or balance.  Nausea or vomiting.  Numbness or tingling.  Sensitivity to light or noise.  Vision or hearing problems.  Reduced sense of smell.  Irritability or mood changes.  Dizziness.  Lack of motivation.  Seeing or hearing things that other people do not see or hear (hallucinations). How is this diagnosed? This condition is diagnosed based on:  Your symptoms.  A description of your injury. You may also have tests, including:  Imaging tests, such as a CT scan or MRI. These are done to look for signs of brain injury.  Neuropsychological tests. These measure your thinking, understanding, learning, and remembering abilities. How is this treated? This condition is treated with physical and mental rest and careful observation, usually at home. If the concussion is severe, you may need to stay home from work for a while. You may be referred to a concussion clinic or to other health care providers for management. It is important that you tell your health care provider if:  You are  taking any medicines, including prescription medicines, over-the-counter medicines, and natural remedies. Some medicines, such as blood thinners (anticoagulants) and aspirin, may increase the chance of complications, such as bleeding.  You are taking or have taken alcohol or illegal drugs. Alcohol and certain other drugs may slow your recovery and can put you at risk of further injury. How fast you will recover from a concussion depends on many factors, such as how severe your concussion is, what part of your  brain was injured, how old you are, and how healthy you were before the concussion. Recovery can take time. It is important to wait to return to activity until a health care provider says it is safe to do that and your symptoms are completely gone. Follow these instructions at home: Activity   Limit activities that require a lot of thought or concentration. These may include:  Doing homework or job-related work.  Watching TV.  Working on the computer.  Playing memory games and puzzles.  Rest. Rest helps the brain to heal. Make sure you:  Get plenty of sleep at night. Avoid staying up late at night.  Keep the same bedtime hours on weekends and weekdays.  Rest during the day. Take naps or rest breaks when you feel tired.  Having another concussion before the first one has healed can be dangerous. Do not do high-risk activities that could cause a second concussion, such as riding a bicycle or playing sports.  Ask your health care provider when you can return to your normal activities, such as school, work, athletics, driving, riding a bicycle, or using heavy machinery. Your ability to react may be slower after a brain injury. Never do these activities if you are dizzy. Your health care provider will likely give you a plan for gradually returning to activities. General instructions   Take over-the-counter and prescription medicines only as told by your health care provider.  Do not drink alcohol until your health care provider says you can.  If it is harder than usual to remember things, write them down.  If you are easily distracted, try to do one thing at a time. For example, do not try to watch TV while fixing dinner.  Talk with family members or close friends when making important decisions.  Watch your symptoms and tell others to do the same. Complications sometimes occur after a concussion. Older adults with a brain injury may have a higher risk of serious complications, such  as a blood clot in the brain.  Tell your teachers, school nurse, school counselor, coach, athletic trainer, or work Freight forwarder about your injury, symptoms, and restrictions. Tell them about what you can or cannot do. They should watch for:  Increased problems with attention or concentration.  Increased difficulty remembering or learning new information.  Increased time needed to complete tasks or assignments.  Increased irritability or decreased ability to cope with stress.  Increased symptoms.  Keep all follow-up visits as told by your health care provider. This is important. How is this prevented? It is very important to avoid another brain injury, especially as you recover. In rare cases, another injury can lead to permanent brain damage, brain swelling, or death. The risk of this is greatest during the first 7-10 days after a head injury. Avoid injuries by:  Wearing a seat belt when riding in a car.  Wearing a helmet when biking, skiing, skateboarding, skating, or doing similar activities.  Avoiding activities that could lead to a second concussion, such as  contact or recreational sports, until your health care provider says it is okay.  Taking safety measures in your home, such as:  Removing clutter and tripping hazards from floors and stairways.  Using grab bars in bathrooms and handrails by stairs.  Placing non-slip mats on floors and in bathtubs.  Improving lighting in dim areas. Contact a health care provider if:  Your symptoms get worse.  You have new symptoms.  You continue to have symptoms for more than 2 weeks. Get help right away if:  You have severe or worsening headaches.  You have weakness or numbness in any part of your body.  Your coordination gets worse.  You vomit repeatedly.  You are sleepier.  The pupil of one eye is larger than the other.  You have convulsions or a seizure.  Your speech is slurred.  Your fatigue, confusion, or  irritability gets worse.  You cannot recognize people or places.  You have neck pain.  It is difficult to wake you up.  You have unusual behavior changes.  You lose consciousness. Summary  A concussion is a brain injury from a direct hit (blow) to the head or body.  A concussion may also be called a mild traumatic brain injury (TBI).  You may have imaging tests and neuropsychological tests to diagnose a concussion.  This condition is treated with physical and mental rest and careful observation.  Ask your health care provider when you can return to your normal activities, such as school, work, athletics, driving, riding a bicycle, or using heavy machinery. Follow safety instructions as told by your health care provider. This information is not intended to replace advice given to you by your health care provider. Make sure you discuss any questions you have with your health care provider. Document Released: 11/29/2003 Document Revised: 08/19/2016 Document Reviewed: 08/19/2016 Elsevier Interactive Patient Education  2017 [employer].
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
## 2017-01-16 DIAGNOSIS — S134XXS Sprain of ligaments of cervical spine, sequela: Secondary | ICD-10-CM | POA: Diagnosis not present

## 2017-01-16 DIAGNOSIS — M542 Cervicalgia: Secondary | ICD-10-CM | POA: Diagnosis not present

## 2017-01-16 DIAGNOSIS — M25561 Pain in right knee: Secondary | ICD-10-CM | POA: Diagnosis not present

## 2017-01-16 LAB — CBC
HEMATOCRIT: 46 % (ref 37.5–51.0)
Hemoglobin: 15.6 g/dL (ref 13.0–17.7)
MCH: 32.8 pg (ref 26.6–33.0)
MCHC: 33.9 g/dL (ref 31.5–35.7)
MCV: 97 fL (ref 79–97)
PLATELETS: 176 10*3/uL (ref 150–379)
RBC: 4.75 x10E6/uL (ref 4.14–5.80)
RDW: 13.2 % (ref 12.3–15.4)
WBC: 4.4 10*3/uL (ref 3.4–10.8)

## 2017-01-16 LAB — COMPREHENSIVE METABOLIC PANEL
ALBUMIN: 4.6 g/dL (ref 3.5–5.5)
ALK PHOS: 40 IU/L (ref 39–117)
ALT: 109 IU/L — ABNORMAL HIGH (ref 0–44)
AST: 170 IU/L — AB (ref 0–40)
Albumin/Globulin Ratio: 2.3 — ABNORMAL HIGH (ref 1.2–2.2)
BUN/Creatinine Ratio: 11 (ref 9–20)
BUN: 11 mg/dL (ref 6–24)
Bilirubin Total: 0.4 mg/dL (ref 0.0–1.2)
CALCIUM: 10.1 mg/dL (ref 8.7–10.2)
CO2: 24 mmol/L (ref 18–29)
CREATININE: 0.97 mg/dL (ref 0.76–1.27)
Chloride: 102 mmol/L (ref 96–106)
GFR calc Af Amer: 109 mL/min/{1.73_m2} (ref >59)
GFR, EST NON AFRICAN AMERICAN: 95 mL/min/{1.73_m2} (ref >59)
GLOBULIN, TOTAL: 2 g/dL (ref 1.5–4.5)
GLUCOSE: 101 mg/dL — AB (ref 65–99)
Potassium: 5 mmol/L (ref 3.5–5.2)
SODIUM: 142 mmol/L (ref 134–144)
Total Protein: 6.6 g/dL (ref 6.0–8.5)

## 2017-01-19 ENCOUNTER — Other Ambulatory Visit: Payer: Self-pay | Admitting: Sports Medicine

## 2017-01-19 DIAGNOSIS — M25561 Pain in right knee: Secondary | ICD-10-CM

## 2017-01-19 DIAGNOSIS — M542 Cervicalgia: Secondary | ICD-10-CM | POA: Diagnosis not present

## 2017-01-19 DIAGNOSIS — S134XXS Sprain of ligaments of cervical spine, sequela: Secondary | ICD-10-CM | POA: Diagnosis not present

## 2017-01-20 ENCOUNTER — Other Ambulatory Visit: Payer: 59

## 2017-01-21 DIAGNOSIS — M25561 Pain in right knee: Secondary | ICD-10-CM | POA: Diagnosis not present

## 2017-01-21 DIAGNOSIS — M542 Cervicalgia: Secondary | ICD-10-CM | POA: Diagnosis not present

## 2017-01-21 DIAGNOSIS — S134XXS Sprain of ligaments of cervical spine, sequela: Secondary | ICD-10-CM | POA: Diagnosis not present

## 2017-01-22 ENCOUNTER — Ambulatory Visit
Admission: RE | Admit: 2017-01-22 | Discharge: 2017-01-22 | Disposition: A | Discharge status: Home / Self Care | Payer: 59 | Source: Ambulatory Visit | Attending: Sports Medicine | Admitting: Sports Medicine

## 2017-01-22 ENCOUNTER — Telehealth: Payer: Self-pay

## 2017-01-22 DIAGNOSIS — M25561 Pain in right knee: Secondary | ICD-10-CM

## 2017-01-22 DIAGNOSIS — S83521A Sprain of posterior cruciate ligament of right knee, initial encounter: Secondary | ICD-10-CM | POA: Diagnosis not present

## 2017-01-22 NOTE — Telephone Encounter (Signed)
Called pt w/ lab results and recommendations to f/u w/ PCP re: elevated liver enzymes. Verbalized understanding and appreciation for call. Lab results faxed per pt request to [facility] # [phone].

## 2017-01-22 NOTE — Telephone Encounter (Signed)
-----   Message from Melvenia Beam, MD sent at 01/16/2017  1:00 PM EDT ----- Patient has elevated liver enzymes, should really see his pcp for evaluation of this thanks

## 2017-01-26 DIAGNOSIS — M542 Cervicalgia: Secondary | ICD-10-CM | POA: Diagnosis not present

## 2017-01-26 DIAGNOSIS — M25561 Pain in right knee: Secondary | ICD-10-CM | POA: Diagnosis not present

## 2017-01-26 DIAGNOSIS — S134XXS Sprain of ligaments of cervical spine, sequela: Secondary | ICD-10-CM | POA: Diagnosis not present

## 2017-01-29 DIAGNOSIS — M25561 Pain in right knee: Secondary | ICD-10-CM | POA: Diagnosis not present

## 2017-01-29 DIAGNOSIS — S134XXS Sprain of ligaments of cervical spine, sequela: Secondary | ICD-10-CM | POA: Diagnosis not present

## 2017-01-29 DIAGNOSIS — M542 Cervicalgia: Secondary | ICD-10-CM | POA: Diagnosis not present

## 2017-01-30 DIAGNOSIS — M25561 Pain in right knee: Secondary | ICD-10-CM | POA: Diagnosis not present

## 2017-01-30 DIAGNOSIS — M25562 Pain in left knee: Secondary | ICD-10-CM | POA: Diagnosis not present

## 2017-01-30 DIAGNOSIS — S82034A Nondisplaced transverse fracture of right patella, initial encounter for closed fracture: Secondary | ICD-10-CM | POA: Diagnosis not present

## 2017-02-02 DIAGNOSIS — M25561 Pain in right knee: Secondary | ICD-10-CM | POA: Diagnosis not present

## 2017-02-02 DIAGNOSIS — M542 Cervicalgia: Secondary | ICD-10-CM | POA: Diagnosis not present

## 2017-02-02 DIAGNOSIS — S134XXS Sprain of ligaments of cervical spine, sequela: Secondary | ICD-10-CM | POA: Diagnosis not present

## 2017-02-05 DIAGNOSIS — M25561 Pain in right knee: Secondary | ICD-10-CM | POA: Diagnosis not present

## 2017-02-05 DIAGNOSIS — M542 Cervicalgia: Secondary | ICD-10-CM | POA: Diagnosis not present

## 2017-02-05 DIAGNOSIS — S134XXS Sprain of ligaments of cervical spine, sequela: Secondary | ICD-10-CM | POA: Diagnosis not present

## 2017-02-10 DIAGNOSIS — M25561 Pain in right knee: Secondary | ICD-10-CM | POA: Diagnosis not present

## 2017-02-10 DIAGNOSIS — M542 Cervicalgia: Secondary | ICD-10-CM | POA: Diagnosis not present

## 2017-02-10 DIAGNOSIS — S134XXS Sprain of ligaments of cervical spine, sequela: Secondary | ICD-10-CM | POA: Diagnosis not present

## 2017-02-17 DIAGNOSIS — M542 Cervicalgia: Secondary | ICD-10-CM | POA: Diagnosis not present

## 2017-02-17 DIAGNOSIS — S134XXS Sprain of ligaments of cervical spine, sequela: Secondary | ICD-10-CM | POA: Diagnosis not present

## 2017-02-17 DIAGNOSIS — M25561 Pain in right knee: Secondary | ICD-10-CM | POA: Diagnosis not present

## 2017-02-19 DIAGNOSIS — S134XXS Sprain of ligaments of cervical spine, sequela: Secondary | ICD-10-CM | POA: Diagnosis not present

## 2017-02-19 DIAGNOSIS — M25561 Pain in right knee: Secondary | ICD-10-CM | POA: Diagnosis not present

## 2017-02-19 DIAGNOSIS — M542 Cervicalgia: Secondary | ICD-10-CM | POA: Diagnosis not present

## 2017-02-24 DIAGNOSIS — S134XXS Sprain of ligaments of cervical spine, sequela: Secondary | ICD-10-CM | POA: Diagnosis not present

## 2017-02-24 DIAGNOSIS — M542 Cervicalgia: Secondary | ICD-10-CM | POA: Diagnosis not present

## 2017-02-24 DIAGNOSIS — M25561 Pain in right knee: Secondary | ICD-10-CM | POA: Diagnosis not present

## 2017-02-26 DIAGNOSIS — M542 Cervicalgia: Secondary | ICD-10-CM | POA: Diagnosis not present

## 2017-02-26 DIAGNOSIS — M25561 Pain in right knee: Secondary | ICD-10-CM | POA: Diagnosis not present

## 2017-02-26 DIAGNOSIS — S134XXS Sprain of ligaments of cervical spine, sequela: Secondary | ICD-10-CM | POA: Diagnosis not present

## 2017-03-04 DIAGNOSIS — S134XXS Sprain of ligaments of cervical spine, sequela: Secondary | ICD-10-CM | POA: Diagnosis not present

## 2017-03-04 DIAGNOSIS — M25561 Pain in right knee: Secondary | ICD-10-CM | POA: Diagnosis not present

## 2017-03-04 DIAGNOSIS — M542 Cervicalgia: Secondary | ICD-10-CM | POA: Diagnosis not present

## 2017-03-05 DIAGNOSIS — S82034D Nondisplaced transverse fracture of right patella, subsequent encounter for closed fracture with routine healing: Secondary | ICD-10-CM | POA: Diagnosis not present

## 2017-03-05 DIAGNOSIS — S8981XD Other specified injuries of right lower leg, subsequent encounter: Secondary | ICD-10-CM | POA: Diagnosis not present

## 2017-03-05 DIAGNOSIS — M25562 Pain in left knee: Secondary | ICD-10-CM | POA: Diagnosis not present

## 2017-03-06 DIAGNOSIS — D2239 Melanocytic nevi of other parts of face: Secondary | ICD-10-CM | POA: Diagnosis not present

## 2017-03-12 DIAGNOSIS — M25562 Pain in left knee: Secondary | ICD-10-CM | POA: Diagnosis not present

## 2017-03-17 DIAGNOSIS — M542 Cervicalgia: Secondary | ICD-10-CM | POA: Diagnosis not present

## 2017-03-17 DIAGNOSIS — S134XXS Sprain of ligaments of cervical spine, sequela: Secondary | ICD-10-CM | POA: Diagnosis not present

## 2017-03-17 DIAGNOSIS — M25561 Pain in right knee: Secondary | ICD-10-CM | POA: Diagnosis not present

## 2017-03-18 DIAGNOSIS — S134XXS Sprain of ligaments of cervical spine, sequela: Secondary | ICD-10-CM | POA: Diagnosis not present

## 2017-03-18 DIAGNOSIS — M542 Cervicalgia: Secondary | ICD-10-CM | POA: Diagnosis not present

## 2017-03-18 DIAGNOSIS — M25561 Pain in right knee: Secondary | ICD-10-CM | POA: Diagnosis not present

## 2017-03-19 DIAGNOSIS — M542 Cervicalgia: Secondary | ICD-10-CM | POA: Diagnosis not present

## 2017-03-19 DIAGNOSIS — M7122 Synovial cyst of popliteal space [Baker], left knee: Secondary | ICD-10-CM | POA: Diagnosis not present

## 2017-03-19 DIAGNOSIS — M25562 Pain in left knee: Secondary | ICD-10-CM | POA: Diagnosis not present

## 2017-03-31 DIAGNOSIS — E291 Testicular hypofunction: Secondary | ICD-10-CM | POA: Diagnosis not present

## 2017-04-07 DIAGNOSIS — M542 Cervicalgia: Secondary | ICD-10-CM | POA: Diagnosis not present

## 2017-04-07 DIAGNOSIS — S134XXS Sprain of ligaments of cervical spine, sequela: Secondary | ICD-10-CM | POA: Diagnosis not present

## 2017-04-07 DIAGNOSIS — M25561 Pain in right knee: Secondary | ICD-10-CM | POA: Diagnosis not present

## 2017-04-08 DIAGNOSIS — M542 Cervicalgia: Secondary | ICD-10-CM | POA: Diagnosis not present

## 2017-04-08 DIAGNOSIS — M7122 Synovial cyst of popliteal space [Baker], left knee: Secondary | ICD-10-CM | POA: Diagnosis not present

## 2017-04-08 DIAGNOSIS — S8981XD Other specified injuries of right lower leg, subsequent encounter: Secondary | ICD-10-CM | POA: Diagnosis not present

## 2017-04-09 DIAGNOSIS — M542 Cervicalgia: Secondary | ICD-10-CM | POA: Diagnosis not present

## 2017-04-09 DIAGNOSIS — S134XXS Sprain of ligaments of cervical spine, sequela: Secondary | ICD-10-CM | POA: Diagnosis not present

## 2017-04-09 DIAGNOSIS — M25561 Pain in right knee: Secondary | ICD-10-CM | POA: Diagnosis not present

## 2017-04-14 DIAGNOSIS — M25561 Pain in right knee: Secondary | ICD-10-CM | POA: Diagnosis not present

## 2017-04-14 DIAGNOSIS — S134XXS Sprain of ligaments of cervical spine, sequela: Secondary | ICD-10-CM | POA: Diagnosis not present

## 2017-04-14 DIAGNOSIS — M542 Cervicalgia: Secondary | ICD-10-CM | POA: Diagnosis not present

## 2017-04-16 DIAGNOSIS — M25561 Pain in right knee: Secondary | ICD-10-CM | POA: Diagnosis not present

## 2017-04-16 DIAGNOSIS — M542 Cervicalgia: Secondary | ICD-10-CM | POA: Diagnosis not present

## 2017-04-16 DIAGNOSIS — S134XXS Sprain of ligaments of cervical spine, sequela: Secondary | ICD-10-CM | POA: Diagnosis not present

## 2017-04-21 DIAGNOSIS — M25561 Pain in right knee: Secondary | ICD-10-CM | POA: Diagnosis not present

## 2017-04-21 DIAGNOSIS — S134XXS Sprain of ligaments of cervical spine, sequela: Secondary | ICD-10-CM | POA: Diagnosis not present

## 2017-04-21 DIAGNOSIS — M542 Cervicalgia: Secondary | ICD-10-CM | POA: Diagnosis not present

## 2017-05-07 DIAGNOSIS — M7122 Synovial cyst of popliteal space [Baker], left knee: Secondary | ICD-10-CM | POA: Diagnosis not present

## 2017-05-07 DIAGNOSIS — M542 Cervicalgia: Secondary | ICD-10-CM | POA: Diagnosis not present

## 2017-05-07 DIAGNOSIS — S8981XD Other specified injuries of right lower leg, subsequent encounter: Secondary | ICD-10-CM | POA: Diagnosis not present

## 2017-05-13 DIAGNOSIS — S134XXS Sprain of ligaments of cervical spine, sequela: Secondary | ICD-10-CM | POA: Diagnosis not present

## 2017-05-13 DIAGNOSIS — M25561 Pain in right knee: Secondary | ICD-10-CM | POA: Diagnosis not present

## 2017-05-13 DIAGNOSIS — M542 Cervicalgia: Secondary | ICD-10-CM | POA: Diagnosis not present

## 2017-05-15 DIAGNOSIS — M25561 Pain in right knee: Secondary | ICD-10-CM | POA: Diagnosis not present

## 2017-05-15 DIAGNOSIS — S134XXS Sprain of ligaments of cervical spine, sequela: Secondary | ICD-10-CM | POA: Diagnosis not present

## 2017-05-15 DIAGNOSIS — M542 Cervicalgia: Secondary | ICD-10-CM | POA: Diagnosis not present

## 2017-05-19 DIAGNOSIS — M25561 Pain in right knee: Secondary | ICD-10-CM | POA: Diagnosis not present

## 2017-05-19 DIAGNOSIS — M542 Cervicalgia: Secondary | ICD-10-CM | POA: Diagnosis not present

## 2017-05-19 DIAGNOSIS — S134XXS Sprain of ligaments of cervical spine, sequela: Secondary | ICD-10-CM | POA: Diagnosis not present

## 2017-07-09 DIAGNOSIS — M25561 Pain in right knee: Secondary | ICD-10-CM | POA: Diagnosis not present

## 2017-07-09 DIAGNOSIS — M25562 Pain in left knee: Secondary | ICD-10-CM | POA: Diagnosis not present

## 2017-07-09 DIAGNOSIS — G8929 Other chronic pain: Secondary | ICD-10-CM | POA: Diagnosis not present
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
## 2019-10-05 ENCOUNTER — Ambulatory Visit: Payer: 59 | Attending: Internal Medicine

## 2019-10-05 ENCOUNTER — Other Ambulatory Visit: Payer: Self-pay

## 2019-10-05 DIAGNOSIS — Z20822 Contact with and (suspected) exposure to covid-19: Secondary | ICD-10-CM

## 2019-10-06 LAB — NOVEL CORONAVIRUS, NAA: SARS-CoV-2, NAA: NOT DETECTED

## 2023-10-16 ENCOUNTER — Ambulatory Visit: Payer: Self-pay | Admitting: Surgery
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
## 2023-10-19 NOTE — Patient Instructions (Signed)
SURGICAL WAITING ROOM VISITATION Patients having surgery or a procedure may have no more than 2 support people in the waiting area - these visitors may rotate in the visitor waiting room.   Due to an increase in RSV and influenza rates and associated hospitalizations, children ages 84 and under may not visit patients in [facility] hospitals. If the patient needs to stay at the hospital during part of their recovery, the visitor guidelines for inpatient rooms apply.  PRE-OP VISITATION  Pre-op nurse will coordinate an appropriate time for 1 support person to accompany the patient in pre-op.  This support person may not rotate.  This visitor will be contacted when the time is appropriate for the visitor to come back in the pre-op area.  Please refer to the [facility] website for the visitor guidelines for Inpatients (after your surgery is over and you are in a regular room).  You are not required to quarantine at this time prior to your surgery. However, you must do this: Hand Hygiene often Do NOT share personal items Notify your provider if you are in close contact with someone who has COVID or you develop fever 100.4 or greater, new onset of sneezing, cough, sore throat, shortness of breath or body aches.  If you test positive for Covid or have been in contact with anyone that has tested positive in the last 10 days please notify you surgeon.    Your procedure is scheduled on:  Thursday  October 22, 2023  Report to [facility] Main Entrance: [address] entrance where the [employer] is available.   Report to admitting at: 07:15    AM  Call this number if you have any questions or problems the morning of surgery [phone]  DO NOT EAT OR DRINK ANYTHING AFTER MIDNIGHT THE NIGHT PRIOR TO YOUR SURGERY / PROCEDURE.   FOLLOW  ANY ADDITIONAL PRE OP INSTRUCTIONS YOU RECEIVED FROM YOUR SURGEON'S OFFICE!!!   Oral Hygiene is also important to reduce your risk of infection.         Remember - BRUSH YOUR TEETH THE MORNING OF SURGERY WITH YOUR REGULAR TOOTHPASTE  Do NOT smoke after Midnight the night before surgery.  STOP TAKING all Vitamins, Herbs and supplements 1 week before your surgery.   Take ONLY these medicines the morning of surgery with A SIP OF WATER:  none   You may not have any metal on your body including  jewelry, and body piercing  Do not wear lotions, powders, cologne, or deodorant  Men may shave face and neck.  Contacts, Hearing Aids, dentures or bridgework may not be worn into surgery. DENTURES WILL BE REMOVED PRIOR TO SURGERY PLEASE DO NOT APPLY "Poly grip" OR ADHESIVES!!!  Patients discharged on the day of surgery will not be allowed to drive home.  Someone NEEDS to stay with you for the first 24 hours after anesthesia.  Do not bring your home medications to the hospital. The Pharmacy will dispense medications listed on your medication list to you during your admission in the Hospital.  Special Instructions: Bring a copy of your healthcare power of attorney and living will documents the day of surgery, if you wish to have them scanned into your [facility] Records- EPIC  Please read over the following fact sheets you were given: IF YOU HAVE QUESTIONS ABOUT YOUR PRE-OP INSTRUCTIONS, PLEASE CALL [phone].   [place] - Preparing for Surgery Before surgery, you can play an important role.  Because skin is not  sterile, your skin needs to be as free of germs as possible.  You can reduce the number of germs on your skin by washing with CHG (chlorahexidine gluconate) soap before surgery.  CHG is an antiseptic cleaner which kills germs and bonds with the skin to continue killing germs even after washing. Please DO NOT use if you have an allergy to CHG or antibacterial soaps.  If your skin becomes reddened/irritated stop using the CHG and inform your nurse when you arrive at Short Stay. Do not shave (including legs and underarms) for at  least 48 hours prior to the first CHG shower.  You may shave your face/neck.  Please follow these instructions carefully:  1.  Shower with CHG Soap the night before surgery and the  morning of surgery.  2.  If you choose to wash your hair, wash your hair first as usual with your normal  shampoo.  3.  After you shampoo, rinse your hair and body thoroughly to remove the shampoo.                             4.  Use CHG as you would any other liquid soap.  You can apply chg directly to the skin and wash.  Gently with a scrungie or clean washcloth.  5.  Apply the CHG Soap to your body ONLY FROM THE NECK DOWN.   Do not use on face/ open                           Wound or open sores. Avoid contact with eyes, ears mouth and genitals (private parts).                       Wash face,  Genitals (private parts) with your normal soap.             6.  Wash thoroughly, paying special attention to the area where your  surgery  will be performed.  7.  Thoroughly rinse your body with warm water from the neck down.  8.  DO NOT shower/wash with your normal soap after using and rinsing off the CHG Soap.            9.  Pat yourself dry with a clean towel.            10.  Wear clean pajamas.            11.  Place clean sheets on your bed the night of your first shower and do not  sleep with pets.  ON THE DAY OF SURGERY : Do not apply any lotions/deodorants the morning of surgery.  Please wear clean clothes to the hospital/surgery center.    FAILURE TO FOLLOW THESE INSTRUCTIONS MAY RESULT IN THE CANCELLATION OF YOUR SURGERY  PATIENT SIGNATURE_________________________________  NURSE SIGNATURE__________________________________  ________________________________________________________________________

## 2023-10-21 ENCOUNTER — Encounter (HOSPITAL_COMMUNITY)
Admission: RE | Admit: 2023-10-21 | Discharge: 2023-10-21 | Disposition: A | Discharge status: Home / Self Care | Payer: 59 | Source: Ambulatory Visit | Attending: Surgery | Admitting: Surgery

## 2023-10-21 ENCOUNTER — Other Ambulatory Visit: Payer: Self-pay

## 2023-10-21 ENCOUNTER — Encounter (HOSPITAL_COMMUNITY): Payer: Self-pay

## 2023-10-21 VITALS — BP 150/88 | HR 78 | Temp 98.1°F | Resp 16 | Ht 72.0 in | Wt 209.0 lb

## 2023-10-21 DIAGNOSIS — K760 Fatty (change of) liver, not elsewhere classified: Secondary | ICD-10-CM | POA: Insufficient documentation

## 2023-10-21 DIAGNOSIS — Z01818 Encounter for other preprocedural examination: Secondary | ICD-10-CM | POA: Insufficient documentation

## 2023-10-21 DIAGNOSIS — I10 Essential (primary) hypertension: Secondary | ICD-10-CM | POA: Insufficient documentation

## 2023-10-21 DIAGNOSIS — Z789 Other specified health status: Secondary | ICD-10-CM | POA: Diagnosis not present

## 2023-10-21 HISTORY — DX: Personal history of urinary calculi: Z87.442

## 2023-10-21 LAB — COMPREHENSIVE METABOLIC PANEL
ALT: 28 U/L (ref 0–44)
AST: 27 U/L (ref 15–41)
Albumin: 4.1 g/dL (ref 3.5–5.0)
Alkaline Phosphatase: 37 U/L — ABNORMAL LOW (ref 38–126)
Anion gap: 10 (ref 5–15)
BUN: 17 mg/dL (ref 6–20)
CO2: 21 mmol/L — ABNORMAL LOW (ref 22–32)
Calcium: 9.2 mg/dL (ref 8.9–10.3)
Chloride: 106 mmol/L (ref 98–111)
Creatinine, Ser: 0.89 mg/dL (ref 0.61–1.24)
GFR, Estimated: >60 mL/min (ref >60)
Glucose, Bld: 89 mg/dL (ref 70–99)
Potassium: 4.1 mmol/L (ref 3.5–5.1)
Sodium: 137 mmol/L (ref 135–145)
Total Bilirubin: 0.8 mg/dL (ref 0.0–1.2)
Total Protein: 6.9 g/dL (ref 6.5–8.1)

## 2023-10-21 LAB — CBC
HCT: 43.5 % (ref 39.0–52.0)
Hemoglobin: 15.2 g/dL (ref 13.0–17.0)
MCH: 32.4 pg (ref 26.0–34.0)
MCHC: 34.9 g/dL (ref 30.0–36.0)
MCV: 92.8 fL (ref 80.0–100.0)
Platelets: 160 10*3/uL (ref 150–400)
RBC: 4.69 MIL/uL (ref 4.22–5.81)
RDW: 11.9 % (ref 11.5–15.5)
WBC: 3.7 10*3/uL — ABNORMAL LOW (ref 4.0–10.5)
nRBC: 0 % (ref 0.0–0.2)

## 2023-10-22 ENCOUNTER — Other Ambulatory Visit: Payer: Self-pay

## 2023-10-22 ENCOUNTER — Encounter (HOSPITAL_COMMUNITY)
Admission: RE | Disposition: A | Discharge status: Home / Self Care | Payer: Self-pay | Source: Home / Self Care | Attending: Surgery

## 2023-10-22 ENCOUNTER — Encounter (HOSPITAL_COMMUNITY): Payer: Self-pay | Admitting: Surgery

## 2023-10-22 ENCOUNTER — Ambulatory Visit (HOSPITAL_COMMUNITY)
Admission: RE | Admit: 2023-10-22 | Discharge: 2023-10-22 | Disposition: A | Discharge status: Home / Self Care | Payer: 59 | Attending: Surgery | Admitting: Surgery

## 2023-10-22 ENCOUNTER — Ambulatory Visit (HOSPITAL_COMMUNITY): Payer: 59 | Admitting: Anesthesiology

## 2023-10-22 ENCOUNTER — Ambulatory Visit (HOSPITAL_BASED_OUTPATIENT_CLINIC_OR_DEPARTMENT_OTHER): Payer: 59 | Admitting: Anesthesiology

## 2023-10-22 DIAGNOSIS — E785 Hyperlipidemia, unspecified: Secondary | ICD-10-CM

## 2023-10-22 DIAGNOSIS — K4090 Unilateral inguinal hernia, without obstruction or gangrene, not specified as recurrent: Secondary | ICD-10-CM | POA: Insufficient documentation

## 2023-10-22 DIAGNOSIS — Z720 Tobacco use: Secondary | ICD-10-CM | POA: Diagnosis not present

## 2023-10-22 HISTORY — PX: INGUINAL HERNIA REPAIR: SHX194

## 2023-10-22 SURGERY — REPAIR, HERNIA, INGUINAL, ADULT
Anesthesia: General | Laterality: Right

## 2023-10-22 MED ORDER — PROPOFOL 10 MG/ML IV BOLUS
INTRAVENOUS | Status: AC
  Filled 2023-10-22: qty 20

## 2023-10-22 MED ORDER — GABAPENTIN 300 MG PO CAPS
300.0000 mg | ORAL_CAPSULE | ORAL | Status: AC
  Administered 2023-10-22: 300 mg via ORAL
  Filled 2023-10-22: qty 1

## 2023-10-22 MED ORDER — ONDANSETRON HCL 4 MG/2ML IJ SOLN
INTRAMUSCULAR | Status: DC | PRN
  Administered 2023-10-22: 4 mg via INTRAVENOUS

## 2023-10-22 MED ORDER — MIDAZOLAM HCL 2 MG/2ML IJ SOLN
INTRAMUSCULAR | Status: AC
  Filled 2023-10-22: qty 2

## 2023-10-22 MED ORDER — LACTATED RINGERS IV SOLN: INTRAVENOUS | Status: DC

## 2023-10-22 MED ORDER — MIDAZOLAM HCL 5 MG/5ML IJ SOLN
INTRAMUSCULAR | Status: DC | PRN
  Administered 2023-10-22: 2 mg via INTRAVENOUS

## 2023-10-22 MED ORDER — EPHEDRINE SULFATE (PRESSORS) 50 MG/ML IJ SOLN
INTRAMUSCULAR | Status: DC | PRN
  Administered 2023-10-22: 10 mg via INTRAVENOUS

## 2023-10-22 MED ORDER — CHLORHEXIDINE GLUCONATE 0.12 % MT SOLN
15.0000 mL | Freq: Once | OROMUCOSAL | Status: AC
Start: 2023-10-22 — End: 2023-10-22
  Administered 2023-10-22: 15 mL via OROMUCOSAL

## 2023-10-22 MED ORDER — ACETAMINOPHEN 500 MG PO TABS
ORAL_TABLET | ORAL | Status: AC
  Filled 2023-10-22: qty 2

## 2023-10-22 MED ORDER — BUPIVACAINE-EPINEPHRINE 0.25% -1:200000 IJ SOLN
INTRAMUSCULAR | Status: DC | PRN
  Administered 2023-10-22: 30 mL

## 2023-10-22 MED ORDER — BUPIVACAINE LIPOSOME 1.3 % IJ SUSP
INTRAMUSCULAR | Status: DC | PRN
  Administered 2023-10-22: 20 mL

## 2023-10-22 MED ORDER — CHLORHEXIDINE GLUCONATE 4 % EX SOLN: 60.0000 mL | Freq: Once | CUTANEOUS | Status: DC

## 2023-10-22 MED ORDER — OXYCODONE HCL 5 MG PO TABS: 5.0000 mg | ORAL_TABLET | Freq: Once | ORAL | Status: DC | PRN

## 2023-10-22 MED ORDER — SUGAMMADEX SODIUM 200 MG/2ML IV SOLN
INTRAVENOUS | Status: DC | PRN
  Administered 2023-10-22: 200 mg via INTRAVENOUS

## 2023-10-22 MED ORDER — BUPIVACAINE-EPINEPHRINE 0.25% -1:200000 IJ SOLN
INTRAMUSCULAR | Status: AC
  Filled 2023-10-22: qty 1

## 2023-10-22 MED ORDER — CEFAZOLIN SODIUM-DEXTROSE 2-4 GM/100ML-% IV SOLN
INTRAVENOUS | Status: AC
  Filled 2023-10-22: qty 100

## 2023-10-22 MED ORDER — OXYCODONE HCL 5 MG PO TABS: 5.0000 mg | ORAL_TABLET | Freq: Three times a day (TID) | ORAL | 0 refills | Status: AC | PRN

## 2023-10-22 MED ORDER — LIDOCAINE HCL (PF) 2 % IJ SOLN
INTRAMUSCULAR | Status: AC
  Filled 2023-10-22: qty 5

## 2023-10-22 MED ORDER — ORAL CARE MOUTH RINSE: 15.0000 mL | Freq: Once | OROMUCOSAL | Status: AC

## 2023-10-22 MED ORDER — DEXAMETHASONE SODIUM PHOSPHATE 4 MG/ML IJ SOLN
INTRAMUSCULAR | Status: DC | PRN
  Administered 2023-10-22: 8 mg via INTRAVENOUS

## 2023-10-22 MED ORDER — PROPOFOL 10 MG/ML IV BOLUS
INTRAVENOUS | Status: DC | PRN
  Administered 2023-10-22: 200 mg via INTRAVENOUS

## 2023-10-22 MED ORDER — KETOROLAC TROMETHAMINE 30 MG/ML IJ SOLN: 30.0000 mg | Freq: Once | INTRAMUSCULAR | Status: DC | PRN

## 2023-10-22 MED ORDER — FENTANYL CITRATE (PF) 100 MCG/2ML IJ SOLN
INTRAMUSCULAR | Status: AC
  Filled 2023-10-22: qty 2

## 2023-10-22 MED ORDER — BUPIVACAINE LIPOSOME 1.3 % IJ SUSP
INTRAMUSCULAR | Status: AC
  Filled 2023-10-22: qty 20

## 2023-10-22 MED ORDER — DOCUSATE SODIUM 100 MG PO CAPS: 100.0000 mg | ORAL_CAPSULE | Freq: Two times a day (BID) | ORAL | 0 refills | Status: AC

## 2023-10-22 MED ORDER — LIDOCAINE HCL (CARDIAC) PF 100 MG/5ML IV SOSY
PREFILLED_SYRINGE | INTRAVENOUS | Status: DC | PRN
  Administered 2023-10-22: 60 mg via INTRAVENOUS

## 2023-10-22 MED ORDER — OXYCODONE HCL 5 MG/5ML PO SOLN: 5.0000 mg | Freq: Once | ORAL | Status: DC | PRN

## 2023-10-22 MED ORDER — ONDANSETRON HCL 4 MG/2ML IJ SOLN
INTRAMUSCULAR | Status: AC
  Filled 2023-10-22: qty 2

## 2023-10-22 MED ORDER — FENTANYL CITRATE (PF) 100 MCG/2ML IJ SOLN
INTRAMUSCULAR | Status: DC | PRN
  Administered 2023-10-22 (×2): 25 ug via INTRAVENOUS

## 2023-10-22 MED ORDER — AMISULPRIDE (ANTIEMETIC) 5 MG/2ML IV SOLN: 10.0000 mg | Freq: Once | INTRAVENOUS | Status: DC | PRN

## 2023-10-22 MED ORDER — FENTANYL CITRATE PF 50 MCG/ML IJ SOSY: 25.0000 ug | PREFILLED_SYRINGE | INTRAMUSCULAR | Status: DC | PRN

## 2023-10-22 MED ORDER — BUPIVACAINE LIPOSOME 1.3 % IJ SUSP: 20.0000 mL | Freq: Once | INTRAMUSCULAR | Status: DC

## 2023-10-22 MED ORDER — CEFAZOLIN SODIUM-DEXTROSE 2-4 GM/100ML-% IV SOLN
2.0000 g | INTRAVENOUS | Status: AC
  Administered 2023-10-22: 2 g via INTRAVENOUS

## 2023-10-22 MED ORDER — ACETAMINOPHEN 500 MG PO TABS
1000.0000 mg | ORAL_TABLET | ORAL | Status: AC
  Administered 2023-10-22: 1000 mg via ORAL

## 2023-10-22 MED ORDER — DEXAMETHASONE SODIUM PHOSPHATE 10 MG/ML IJ SOLN
INTRAMUSCULAR | Status: AC
  Filled 2023-10-22: qty 1

## 2023-10-22 SURGICAL SUPPLY — 28 items
BAG COUNTER SPONGE SURGICOUNT (BAG) IMPLANT
BENZOIN TINCTURE PRP APPL 2/3 (GAUZE/BANDAGES/DRESSINGS) ×2 IMPLANT
BLADE SURG 15 STRL LF DISP TIS (BLADE) ×2 IMPLANT
CHLORAPREP W/TINT 26 (MISCELLANEOUS) ×2 IMPLANT
COVER SURGICAL LIGHT HANDLE (MISCELLANEOUS) ×2 IMPLANT
DRAIN PENROSE 0.5X18 (DRAIN) ×2 IMPLANT
DRAPE LAPAROSCOPIC ABDOMINAL (DRAPES) ×2 IMPLANT
ELECT REM PT RETURN 15FT ADLT (MISCELLANEOUS) ×2 IMPLANT
GAUZE SPONGE 4X4 12PLY STRL (GAUZE/BANDAGES/DRESSINGS) IMPLANT
GLOVE BIO SURGEON STRL SZ 6 (GLOVE) ×2 IMPLANT
GLOVE INDICATOR 6.5 STRL GRN (GLOVE) ×2 IMPLANT
GOWN STRL REUS W/ TWL LRG LVL3 (GOWN DISPOSABLE) ×2 IMPLANT
KIT BASIN OR (CUSTOM PROCEDURE TRAY) ×2 IMPLANT
KIT TURNOVER KIT A (KITS) IMPLANT
MARKER SKIN DUAL TIP RULER LAB (MISCELLANEOUS) ×2 IMPLANT
MESH ULTRAPRO 3X6 7.6X15CM (Mesh General) IMPLANT
NDL HYPO 22X1.5 SAFETY MO (MISCELLANEOUS) ×2 IMPLANT
NEEDLE HYPO 22X1.5 SAFETY MO (MISCELLANEOUS) ×1
PACK GENERAL/GYN (CUSTOM PROCEDURE TRAY) ×2 IMPLANT
SPIKE FLUID TRANSFER (MISCELLANEOUS) ×2 IMPLANT
STRIP CLOSURE SKIN 1/2X4 (GAUZE/BANDAGES/DRESSINGS) ×2 IMPLANT
SUT ETHIBOND 0 MO6 C/R (SUTURE) ×2 IMPLANT
SUT MNCRL AB 4-0 PS2 18 (SUTURE) ×2 IMPLANT
SUT PDS AB 0 CT1 36 (SUTURE) ×4 IMPLANT
SUT VIC AB 3-0 SH 27XBRD (SUTURE) ×4 IMPLANT
SUT VICRYL 3 0 BR 18 UND (SUTURE) ×2 IMPLANT
SYR CONTROL 10ML LL (SYRINGE) ×2 IMPLANT
TOWEL OR 17X26 10 PK STRL BLUE (TOWEL DISPOSABLE) ×2 IMPLANT
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
## 2023-10-22 NOTE — Anesthesia Postprocedure Evaluation (Signed)
Anesthesia Post Note  Patient: Chris Collier.  Procedure(s) Performed: OPEN RIGHT INGUINAL HERNIA REPAIR WITH MESH (Right)     Patient location during evaluation: PACU Anesthesia Type: General Level of consciousness: awake Pain management: pain level controlled Vital Signs Assessment: post-procedure vital signs reviewed and stable Respiratory status: spontaneous breathing, nonlabored ventilation and respiratory function stable Cardiovascular status: blood pressure returned to baseline and stable Postop Assessment: no apparent nausea or vomiting Anesthetic complications: no   No notable events documented.  Last Vitals:  Vitals:   10/22/23 1145 10/22/23 1158  BP:  (!) 136/90  Pulse:  71  Resp: 12 14  Temp: (!) 36.3 C 36.5 C  SpO2:  98%    Last Pain:  Vitals:   10/22/23 1158  TempSrc:   PainSc: 3                  Campbell Kray P Cain Fitzhenry

## 2023-10-22 NOTE — Interval H&P Note (Signed)
History and Physical Interval Note:  10/22/2023 7:35 AM  Chris Collier.  has presented today for surgery, with the diagnosis of INGUINAL HERNIA.  The various methods of treatment have been discussed with the patient and family. After consideration of risks, benefits and other options for treatment, the patient has consented to  Procedure(s): OPEN RIGHT INGUINAL HERNIA REPAIR WITH MESH (Right) as a surgical intervention.  The patient's history has been reviewed, patient examined, no change in status, stable for surgery.  I have reviewed the patient's chart and labs.  Questions were answered to the patient's satisfaction.     [name] Lollie Sails

## 2023-10-22 NOTE — Anesthesia Procedure Notes (Signed)
Procedure Name: LMA Insertion Date/Time: 10/22/2023 9:51 AM  Performed by: Dennison Nancy, CRNAPre-anesthesia Checklist: Patient identified, Emergency Drugs available, Suction available, Patient being monitored and Timeout performed Patient Re-evaluated:Patient Re-evaluated prior to induction Oxygen Delivery Method: Circle system utilized Preoxygenation: Pre-oxygenation with 100% oxygen Induction Type: IV induction Ventilation: Mask ventilation without difficulty LMA: LMA with gastric port inserted and LMA inserted LMA Size: 4.0 Number of attempts: 1 Dental Injury: Teeth and Oropharynx as per pre-operative assessment

## 2023-10-22 NOTE — Transfer of Care (Signed)
Immediate Anesthesia Transfer of Care Note  Patient: Chris Collier.  Procedure(s) Performed: OPEN RIGHT INGUINAL HERNIA REPAIR WITH MESH (Right)  Patient Location: PACU  Anesthesia Type:General  Level of Consciousness: awake and alert   Airway & Oxygen Therapy: Patient Spontanous Breathing and Patient connected to face mask oxygen  Post-op Assessment: Report given to RN and Post -op Vital signs reviewed and stable  Post vital signs: Reviewed and stable  Last Vitals:  Vitals Value Taken Time  BP 115/90 10/22/23 1105  Temp 36.2 C 10/22/23 1105  Pulse 77 10/22/23 1107  Resp 13 10/22/23 1107  SpO2 100 % 10/22/23 1107  Vitals shown include unfiled device data.  Last Pain:  Vitals:   10/22/23 1105  TempSrc:   PainSc: Asleep         Complications: No notable events documented.

## 2023-10-22 NOTE — Discharge Instructions (Signed)
HERNIA REPAIR: POST OP INSTRUCTIONS   EAT Gradually transition to a high fiber diet with a fiber supplement over the next few weeks after discharge.   WALK Walk an hour a day (cumulative- not all at once).  Control your pain to do that.    CONTROL PAIN Control pain so that you can walk, sleep, tolerate sneezing/coughing, and go up/down stairs.  HAVE A BOWEL MOVEMENT DAILY Keep your bowels regular to avoid problems.  OK to try a laxative to override constipation.  OK to use an antidiarrheal to slow down diarrhea.  Call if not better after 2 tries  CALL IF YOU HAVE PROBLEMS/CONCERNS Call if you are still struggling despite following these instructions. Call if you have concerns not answered by these instructions  ######################################################################    DIET: Follow a light bland diet & liquids the first 24 hours after arrival home, such as soup, liquids, starches, etc.  Be sure to drink plenty of fluids.  Quickly advance to a usual solid diet within a few days.  Avoid fast food or heavy meals initially as you are more likely to get nauseated or have irregular bowels.   Take your usually prescribed home medications unless otherwise directed.  PAIN CONTROL: Pain is best controlled by a usual combination of three different methods TOGETHER: Ice/Heat Over the counter pain medication Prescription pain medication Most patients will experience some swelling and bruising around the hernia(s) such as the bellybutton, groins, or old incisions.  Ice packs or heating pads (30-60 minutes up to 6 times a day) will help. Use ice for the first few days to help decrease swelling and bruising, then switch to heat to help relax tight/sore spots and speed recovery.  Some people prefer to use ice alone, heat alone, alternating between ice & heat.  Experiment to what works for you.  Swelling and bruising can take several weeks to resolve.   It is helpful to take an  over-the-counter pain medication regularly for the first days: Naproxen (Aleve, etc)  Two 220mg  tabs twice a day OR Ibuprofen (Advil, etc) Three 200mg  tabs four times a day (every meal & bedtime) AND Acetaminophen (Tylenol, etc) 325-650mg  four times a day (every meal & bedtime) A  prescription for pain medication should be given to you upon discharge.  Take your pain medication as prescribed, IF NEEDED.  If you are having problems/concerns with the prescription medicine (does not control pain, nausea, vomiting, rash, itching, etc), please call us [phone] to see if we need to switch you to a different pain medicine that will work better for you and/or control your side effect better. If you need a refill on your pain medication, please contact your pharmacy.  They will contact our office to request authorization. Prescriptions will not be filled after 5 pm or on week-ends.  Avoid getting constipated.  Between the surgery and the pain medications, it is common to experience some constipation.  Increasing fluid intake and taking a fiber supplement (such as Metamucil, Citrucel, FiberCon, MiraLax, etc) 1-2 times a day regularly will usually help prevent this problem from occurring.  A mild laxative (prune juice, Milk of Magnesia, MiraLax, etc) should be taken according to package directions if there are no bowel movements after 48 hours.    Wash / shower every day, starting 2 days after surgery.  You may shower over the steri strips which are waterproof.  No rubbing, scrubbing, lotions or ointments to incision(s). Do not soak or submerge.   Remove  your outer bandage 2 days after surgery. Steri strips (white tapes) will peel off after 1-2 weeks. You may leave the incision open to air.  You may replace a dressing/Band-Aid to cover an incision for comfort if you wish.  Continue to shower over incision(s) after the dressing is off.  ACTIVITIES as tolerated:   You may resume regular (light) daily  activities beginning the next day--such as daily self-care, walking, climbing stairs--gradually increasing activities as tolerated.  Control your pain so that you can walk an hour a day.  If you can walk 30 minutes without difficulty, it is safe to try more intense activity such as jogging, treadmill, bicycling, low-impact aerobics, swimming, etc. Refrain from the most intensive and strenuous activity such as sit-ups, heavy lifting, contact sports, etc  Refrain from any heavy lifting or straining until 6 weeks after surgery.   DO NOT PUSH THROUGH PAIN.  Let pain be your guide: If it hurts to do something, don't do it.  Pain is your body warning you to avoid that activity for another week until the pain goes down. You may drive when you are no longer taking prescription pain medication, you can comfortably wear a seatbelt, and you can safely maneuver your car and apply brakes. You may have sexual intercourse when it is comfortable.   FOLLOW UP in our office Please call [facility] at [phone] to set up an appointment to see your surgeon in the office for a follow-up appointment approximately 2-3 weeks after your surgery. Make sure that you call for this appointment the day you arrive home to insure a convenient appointment time.  9.  If you have disability of FMLA / Family leave forms, please bring the forms to the office for processing.  (do not give to your surgeon).  WHEN TO CALL us [phone]: Poor pain control Reactions / problems with new medications (rash/itching, nausea, etc)  Fever over 101.5 F (38.5 C) Inability to urinate Nausea and/or vomiting Worsening swelling or bruising Continued bleeding from incision. Increased pain, redness, or drainage from the incision   The clinic staff is available to answer your questions during regular business hours (8:30am-5pm).  Please don't hesitate to call and ask to speak to one of our nurses for clinical concerns.   If you have a medical  emergency, go to the nearest emergency room or call 911.  A surgeon from [facility] is always on call at the hospitals in [facility], [place] [address], [address], [place], [place]  [address] ?  [address] [address], [place], [place]   [address] MAIN: [phone] ? TOLL FREE: [phone] ? FAX: [phone] [email or web address]

## 2023-10-22 NOTE — Op Note (Signed)
Operative Note  Arles Rumbold  [id number]  [id number]  10/22/2023   Surgeon: Berna Bue MD FACS   Procedure performed: Open right inguinal hernia repair with mesh   Preop diagnosis:  right inguinal hernia   Post-op diagnosis/intraop findings:  Moderate indirect and direct right inguinal hernia   Specimens: none   EBL: 5cc   Complications: none   Description of procedure: After confirming informed consent, the patient was taken to the operating room and placed supine on the operating room table where general anesthesia was initiated, preoperative antibiotics were administered, SCDs applied, and a formal timeout was performed. The groin was clipped, prepped and draped in the usual sterile fashion. An oblique incision was made the just above the inguinal ligament after infiltrating the tissues with local anesthetic (Exparel mixed with quarter percent Marcaine with epinephrine). Soft tissues were dissected using electrocautery until the external oblique aponeurosis was encountered. This was divided sharply to expand the external ring. A plane was bluntly developed between the spermatic cord and the external oblique. The spermatic cord was then bluntly dissected away from the pubic tubercle and encircled with a Penrose. Inspection of the inguinal anatomy revealed a moderate indirect hernia largely consisting of preperitoneal fat as well as a moderate direct hernia with disruption of the inguinal floor. The indirect hernia sac was bluntly dissected away from the cord structures and skeletonized to the level of the internal ring, where it was reduced intact into the abdomen. The inguinal floor was reconstructed suturing the conjoined tendon to the inguinal ligament with interrupted 0 PDS, leaving an internal ring just sufficient for the cord structures. A 3 x 6 piece of UltraPro mesh was brought onto the field and trimmed to approximate the field. This was sutured to the pubic tubercle fascia,  inferior shelving edge and to the internal oblique superiorly with interrupted 0 ethibonds. The tails of the mesh were wrapped around the spermatic cord, ensuring adequate room for the cord, and sutured to each other with 0 ethibond, and then directed laterally to lie flat beneath the external oblique aponeurosis.  An additional suture was placed to reinforce the slit and the mesh.  Hemostasis was ensured within the wound. The Penrose was removed. The external oblique aponeurosis was reapproximated with a running 3-0 Vicryl to re-create a narrowed external ring. More local was infiltrated around the pubic tubercle and in the plane just below the external oblique. The Scarpa's was reapproximated with interrupted 3-0 Vicryls. The skin was closed with a running subcuticular 4-0 Monocryl. The remainder of the local was injected in the subcutaneous and subcuticular space. The field was then cleaned, benzoin and Steri-Strips and sterile bandage were applied. Both testicles were palpated in the scrotum at the end of the case. The patient was then awakened, extubated and taken to PACU in stable condition.    All counts were correct at the completion of the case

## 2023-10-23 ENCOUNTER — Encounter (HOSPITAL_COMMUNITY): Payer: Self-pay | Admitting: Surgery

## 2023-12-02 ENCOUNTER — Other Ambulatory Visit: Payer: Self-pay | Admitting: Surgery

## 2023-12-02 DIAGNOSIS — K4090 Unilateral inguinal hernia, without obstruction or gangrene, not specified as recurrent: Secondary | ICD-10-CM

## 2023-12-08 ENCOUNTER — Ambulatory Visit
Admission: RE | Admit: 2023-12-08 | Discharge: 2023-12-08 | Disposition: A | Discharge status: Home / Self Care | Source: Ambulatory Visit | Attending: Surgery

## 2023-12-08 DIAGNOSIS — K4090 Unilateral inguinal hernia, without obstruction or gangrene, not specified as recurrent: Secondary | ICD-10-CM

## 2023-12-08 MED ORDER — IOPAMIDOL (ISOVUE-300) INJECTION 61%
100.0000 mL | Freq: Once | INTRAVENOUS | Status: AC | PRN
  Administered 2023-12-08: 100 mL via INTRAVENOUS

## 2024-04-27 ENCOUNTER — Other Ambulatory Visit (HOSPITAL_BASED_OUTPATIENT_CLINIC_OR_DEPARTMENT_OTHER): Payer: Self-pay | Admitting: Family Medicine

## 2024-04-27 DIAGNOSIS — Z8249 Family history of ischemic heart disease and other diseases of the circulatory system: Secondary | ICD-10-CM

## 2024-05-09 ENCOUNTER — Ambulatory Visit (HOSPITAL_BASED_OUTPATIENT_CLINIC_OR_DEPARTMENT_OTHER)
Admission: RE | Admit: 2024-05-09 | Discharge: 2024-05-09 | Disposition: A | Discharge status: Home / Self Care | Payer: Self-pay | Source: Ambulatory Visit | Attending: Family Medicine | Admitting: Family Medicine

## 2024-05-09 DIAGNOSIS — Z8249 Family history of ischemic heart disease and other diseases of the circulatory system: Secondary | ICD-10-CM
